# Patient Record
Sex: Female | Born: 1960 | Race: White | Hispanic: No | Marital: Married | Smoking: Never smoker
Health system: Southern US, Community
[De-identification: ages and names within clinical notes are randomized; demographics above are authoritative.]

## PROBLEM LIST (undated history)

## (undated) DIAGNOSIS — Z0000 Encounter for general adult medical examination without abnormal findings: Principal | ICD-10-CM

## (undated) DIAGNOSIS — Z789 Other specified health status: Secondary | ICD-10-CM

## (undated) DIAGNOSIS — Z8619 Personal history of other infectious and parasitic diseases: Secondary | ICD-10-CM

## (undated) DIAGNOSIS — R51 Headache: Secondary | ICD-10-CM

## (undated) DIAGNOSIS — G43909 Migraine, unspecified, not intractable, without status migrainosus: Secondary | ICD-10-CM

## (undated) DIAGNOSIS — E782 Mixed hyperlipidemia: Secondary | ICD-10-CM

## (undated) HISTORY — DX: Migraine, unspecified, not intractable, without status migrainosus: G43.909

## (undated) HISTORY — DX: Mixed hyperlipidemia: E78.2

## (undated) HISTORY — DX: Encounter for general adult medical examination without abnormal findings: Z00.00

## (undated) HISTORY — DX: Personal history of other infectious and parasitic diseases: Z86.19

---

## 1999-03-12 ENCOUNTER — Other Ambulatory Visit: Admission: RE | Admit: 1999-03-12 | Discharge: 1999-03-12 | Payer: Self-pay | Admitting: *Deleted

## 2000-03-24 ENCOUNTER — Other Ambulatory Visit: Admission: RE | Admit: 2000-03-24 | Discharge: 2000-03-24 | Payer: Self-pay | Admitting: *Deleted

## 2001-04-06 ENCOUNTER — Other Ambulatory Visit: Admission: RE | Admit: 2001-04-06 | Discharge: 2001-04-06 | Payer: Self-pay | Admitting: *Deleted

## 2002-04-05 ENCOUNTER — Other Ambulatory Visit: Admission: RE | Admit: 2002-04-05 | Discharge: 2002-04-05 | Payer: Self-pay | Admitting: *Deleted

## 2003-03-21 ENCOUNTER — Other Ambulatory Visit: Admission: RE | Admit: 2003-03-21 | Discharge: 2003-03-21 | Payer: Self-pay | Admitting: *Deleted

## 2004-04-02 ENCOUNTER — Other Ambulatory Visit: Admission: RE | Admit: 2004-04-02 | Discharge: 2004-04-02 | Payer: Self-pay | Admitting: *Deleted

## 2005-04-01 ENCOUNTER — Other Ambulatory Visit: Admission: RE | Admit: 2005-04-01 | Discharge: 2005-04-01 | Payer: Self-pay | Admitting: *Deleted

## 2005-11-01 ENCOUNTER — Ambulatory Visit: Payer: Self-pay | Admitting: Family Medicine

## 2006-03-31 ENCOUNTER — Other Ambulatory Visit: Admission: RE | Admit: 2006-03-31 | Discharge: 2006-03-31 | Payer: Self-pay | Admitting: *Deleted

## 2007-04-13 ENCOUNTER — Other Ambulatory Visit: Admission: RE | Admit: 2007-04-13 | Discharge: 2007-04-13 | Payer: Self-pay | Admitting: *Deleted

## 2007-07-18 ENCOUNTER — Encounter: Payer: Self-pay | Admitting: Family Medicine

## 2008-05-17 ENCOUNTER — Ambulatory Visit: Payer: Self-pay | Admitting: Family Medicine

## 2008-05-22 ENCOUNTER — Encounter (INDEPENDENT_AMBULATORY_CARE_PROVIDER_SITE_OTHER): Payer: Self-pay | Admitting: *Deleted

## 2008-05-30 ENCOUNTER — Telehealth (INDEPENDENT_AMBULATORY_CARE_PROVIDER_SITE_OTHER): Payer: Self-pay | Admitting: *Deleted

## 2010-01-11 HISTORY — PX: DILATION AND CURETTAGE OF UTERUS: SHX78

## 2012-02-22 ENCOUNTER — Encounter (HOSPITAL_BASED_OUTPATIENT_CLINIC_OR_DEPARTMENT_OTHER): Payer: Self-pay | Admitting: *Deleted

## 2012-02-23 ENCOUNTER — Other Ambulatory Visit: Payer: Self-pay | Admitting: Orthopedic Surgery

## 2012-02-25 ENCOUNTER — Encounter (HOSPITAL_BASED_OUTPATIENT_CLINIC_OR_DEPARTMENT_OTHER)
Admission: RE | Disposition: A | Discharge status: Home / Self Care | Payer: Self-pay | Source: Ambulatory Visit | Attending: Orthopedic Surgery

## 2012-02-25 ENCOUNTER — Ambulatory Visit (HOSPITAL_BASED_OUTPATIENT_CLINIC_OR_DEPARTMENT_OTHER)
Admission: RE | Admit: 2012-02-25 | Discharge: 2012-02-25 | Disposition: A | Discharge status: Home / Self Care | Payer: BC Managed Care – PPO | Source: Ambulatory Visit | Attending: Orthopedic Surgery | Admitting: Orthopedic Surgery

## 2012-02-25 DIAGNOSIS — M6530 Trigger finger, unspecified finger: Secondary | ICD-10-CM | POA: Insufficient documentation

## 2012-02-25 HISTORY — DX: Other specified health status: Z78.9

## 2012-02-25 HISTORY — DX: Headache: R51

## 2012-02-25 HISTORY — PX: TRIGGER FINGER RELEASE: SHX641

## 2012-02-25 SURGERY — MINOR RELEASE TRIGGER FINGER/A-1 PULLEY
Anesthesia: LOCAL | Site: Thumb | Laterality: Right | Wound class: Clean

## 2012-02-25 MED ORDER — CEFAZOLIN SODIUM-DEXTROSE 2-3 GM-% IV SOLR: 2.0000 g | INTRAVENOUS | Status: DC

## 2012-02-25 MED ORDER — LIDOCAINE HCL 2 % IJ SOLN
INTRAMUSCULAR | Status: DC | PRN
  Administered 2012-02-25: 6 mL via INTRADERMAL

## 2012-02-25 MED ORDER — POVIDONE-IODINE 7.5 % EX SOLN: Freq: Once | CUTANEOUS | Status: DC

## 2012-02-25 MED ORDER — HYDROCODONE-ACETAMINOPHEN 5-325 MG PO TABS: 1.0000 | ORAL_TABLET | Freq: Four times a day (QID) | ORAL | Status: DC | PRN

## 2012-02-25 SURGICAL SUPPLY — 47 items
BANDAGE ELASTIC 3 VELCRO ST LF (GAUZE/BANDAGES/DRESSINGS) ×2 IMPLANT
BLADE SURG 11 STRL SS (BLADE) IMPLANT
BLADE SURG 15 STRL LF DISP TIS (BLADE) ×1 IMPLANT
BLADE SURG 15 STRL SS (BLADE) ×2
BNDG CMPR 9X4 STRL LF SNTH (GAUZE/BANDAGES/DRESSINGS)
BNDG CMPR MD 5X2 ELC HKLP STRL (GAUZE/BANDAGES/DRESSINGS) ×1
BNDG ELASTIC 2 VLCR STRL LF (GAUZE/BANDAGES/DRESSINGS) ×2 IMPLANT
BNDG ESMARK 4X9 LF (GAUZE/BANDAGES/DRESSINGS) IMPLANT
CLOTH BEACON ORANGE TIMEOUT ST (SAFETY) ×2 IMPLANT
COVER MAYO STAND STRL (DRAPES) ×2 IMPLANT
COVER TABLE BACK 60X90 (DRAPES) ×2 IMPLANT
CUFF TOURNIQUET SINGLE 18IN (TOURNIQUET CUFF) ×1 IMPLANT
DECANTER SPIKE VIAL GLASS SM (MISCELLANEOUS) ×2 IMPLANT
DRAPE EXTREMITY T 121X128X90 (DRAPE) ×2 IMPLANT
DRSG EMULSION OIL 3X3 NADH (GAUZE/BANDAGES/DRESSINGS) IMPLANT
DURAPREP 26ML APPLICATOR (WOUND CARE) ×2 IMPLANT
ELECT NDL TIP 2.8 STRL (NEEDLE) IMPLANT
ELECT NEEDLE TIP 2.8 STRL (NEEDLE) IMPLANT
ELECT REM PT RETURN 9FT ADLT (ELECTROSURGICAL)
ELECTRODE REM PT RTRN 9FT ADLT (ELECTROSURGICAL) IMPLANT
GAUZE SPONGE 4X4 12PLY STRL LF (GAUZE/BANDAGES/DRESSINGS) ×2 IMPLANT
GLOVE BIOGEL PI IND STRL 7.0 (GLOVE) IMPLANT
GLOVE BIOGEL PI IND STRL 8 (GLOVE) ×2 IMPLANT
GLOVE BIOGEL PI INDICATOR 7.0 (GLOVE) ×2
GLOVE BIOGEL PI INDICATOR 8 (GLOVE) ×2
GLOVE ECLIPSE 6.5 STRL STRAW (GLOVE) ×1 IMPLANT
GLOVE ECLIPSE 7.5 STRL STRAW (GLOVE) ×4 IMPLANT
GOWN BRE IMP PREV XXLGXLNG (GOWN DISPOSABLE) ×2 IMPLANT
GOWN PREVENTION PLUS XLARGE (GOWN DISPOSABLE) ×2 IMPLANT
GOWN PREVENTION PLUS XXLARGE (GOWN DISPOSABLE) ×2 IMPLANT
NDL HYPO 25X1 1.5 SAFETY (NEEDLE) ×1 IMPLANT
NEEDLE HYPO 25X1 1.5 SAFETY (NEEDLE) ×4 IMPLANT
PACK BASIN DAY SURGERY FS (CUSTOM PROCEDURE TRAY) ×2 IMPLANT
PAD CAST 3X4 CTTN HI CHSV (CAST SUPPLIES) ×1 IMPLANT
PADDING CAST ABS 4INX4YD NS (CAST SUPPLIES) ×1
PADDING CAST ABS COTTON 4X4 ST (CAST SUPPLIES) ×1 IMPLANT
PADDING CAST COTTON 3X4 STRL (CAST SUPPLIES) ×2
PADDING UNDERCAST 2  STERILE (CAST SUPPLIES) ×2 IMPLANT
PENCIL BUTTON HOLSTER BLD 10FT (ELECTRODE) IMPLANT
STOCKINETTE 4X48 STRL (DRAPES) ×2 IMPLANT
SUT ETHILON 4 0 PS 2 18 (SUTURE) ×2 IMPLANT
SYR BULB 3OZ (MISCELLANEOUS) ×2 IMPLANT
SYR CONTROL 10ML LL (SYRINGE) ×2 IMPLANT
TOWEL OR 17X24 6PK STRL BLUE (TOWEL DISPOSABLE) ×3 IMPLANT
TOWEL OR NON WOVEN STRL DISP B (DISPOSABLE) ×2 IMPLANT
UNDERPAD 30X30 INCONTINENT (UNDERPADS AND DIAPERS) ×2 IMPLANT
WATER STERILE IRR 1000ML POUR (IV SOLUTION) ×2 IMPLANT

## 2012-02-25 NOTE — Brief Op Note (Signed)
02/25/2012  8:11 AM  PATIENT:  Marylu Lund  52 y.o. female  PRE-OPERATIVE DIAGNOSIS:  RIGHT TRIGGER THUMB  POST-OPERATIVE DIAGNOSIS:  RIGHT TRIGGER THUMB  PROCEDURE:  Procedure(s): MINOR RELEASE TRIGGER FINGER/A-1 PULLEY (Right)  SURGEON:  Surgeon(s) and Role:    * Harvie Junior, MD - Primary  PHYSICIAN ASSISTANT:   ASSISTANTS: bethune   ANESTHESIA:   local  EBL:     BLOOD ADMINISTERED:none  DRAINS: none   LOCAL MEDICATIONS USED:  LIDOCAINE   SPECIMEN:  No Specimen  DISPOSITION OF SPECIMEN:  N/A  COUNTS:  YES  TOURNIQUET:  * Missing tourniquet times found for documented tourniquets in log:  [id number] *  DICTATION: .Other Dictation: Dictation Number [id number]  PLAN OF CARE: Discharge to home after PACU  PATIENT DISPOSITION:  PACU - hemodynamically stable.   Delay start of Pharmacological VTE agent (>24hrs) due to surgical blood loss or risk of bleeding: no

## 2012-02-25 NOTE — Op Note (Signed)
NAME:  Alicia Parrish, Alicia Parrish NO.:  [id number]  MEDICAL RECORD NO.:  [id number]  LOCATION:                                 FACILITY:  PHYSICIAN:  Harvie Junior, M.D.        DATE OF BIRTH:  DATE OF PROCEDURE:  02/25/2012 DATE OF DISCHARGE:                              OPERATIVE REPORT   PREOPERATIVE DIAGNOSIS:  Trigger finger thumb, right.  POSTOPERATIVE DIAGNOSIS:  Trigger finger thumb, right.  PROCEDURE:  Right trigger thumb release.  SURGEON:  Harvie Junior, MD  ASSISTANT:  Marshia Ly, PA  ANESTHESIA:  General.  BRIEF HISTORY:  Ms. Murthy is a 52 year old female with history of having a right trigger thumb.  We treated conservatively for prolonged period of time.  She was to be brought to the operating room for release of right trigger thumb.  For financial reasons, the patient wanted to be done under local.  We discussed with her the risk of having to use significant amounts of numbing medicine in the area where we operate, clouding the tissue planes.  She understood this risk and still wanted to be done under local and she was brought to the operating room for this procedure.  DESCRIPTION OF PROCEDURE:  The patient was brought to the local operating room and at this point after prepping the skin, I injected her in a V formation just proximal to where we anticipated making our incision, then went on to scrub.  At this point, the patient was prepped and draped in usual sterile fashion.  Following this, the arm was exsanguinated, blood pressure inflated to 250 mmHg.  Following this, an incision was made slightly more generous than our typical incision and the subcutaneous tissue down the level of the A1 pulley.  Tissue planes were clouded as anticipated and [employer] sponges were used to try to identify the tissue planes as well as we could.  We used retractors to hold the digital nerves out of the way as best we could and ultimately were able to identify  the A1 pulley.  Small rent was made with a knife and then the A1 pulley was released with a scissor.  We were able to pull the tendon into the wound with a right angle retractor.  At that point, I was able to actually ask the patient to flex and extend the thumb.  No triggering was identified at this point.  At this point, the wound was irrigated, suctioned dry, closed with a running stitch.  Sterile compressive dressing was applied.  The patient taken to recovery room and was noted to be in satisfactory condition.  Estimated blood loss for procedure was none.     Harvie Junior, M.D.     Ranae Plumber  D:  02/25/2012  T:  02/25/2012  Job:  [id number]

## 2012-02-25 NOTE — H&P (Signed)
  PREOPERATIVE H&P  Chief Complaint: r trigger thumb  HPI: Alicia Parrish is a 52 y.o. female who presents for evaluation of r trigger thumb. It has been present for greater than 3 months and has been worsening. She has failed conservative measures. Pain is rated as moderate.  Past Medical History  Diagnosis Date  . Medical history non-contributory   . Headache    Past Surgical History  Procedure Laterality Date  . Dilation and curettage of uterus  2012   History   Social History  . Marital Status: Married    Spouse Name: N/A    Number of Children: N/A  . Years of Education: N/A   Social History Main Topics  . Smoking status: Never Smoker   . Smokeless tobacco: None  . Alcohol Use: Yes     Comment: rare  . Drug Use: No  . Sexually Active: None   Other Topics Concern  . None   Social History Narrative  . None   History reviewed. No pertinent family history. No Known Allergies Prior to Admission medications   Medication Sig Start Date End Date Taking? Authorizing Provider  calcium carbonate (OS-CAL) 600 MG TABS Take 600 mg by mouth 2 (two) times daily with a meal.   Yes Historical Provider, MD  Multiple Vitamins-Minerals (MULTIVITAMIN WITH MINERALS) tablet Take 1 tablet by mouth daily.   Yes Historical Provider, MD  rizatriptan (MAXALT) 10 MG tablet Take 10 mg by mouth as needed for migraine. May repeat in 2 hours if needed   Yes Historical Provider, MD  zinc gluconate 50 MG tablet Take 50 mg by mouth daily.   Yes Historical Provider, MD     Positive ROS: none  All other systems have been reviewed and were otherwise negative with the exception of those mentioned in the HPI and as above.  Physical Exam: There were no vitals filed for this visit.  General: Alert, no acute distress Cardiovascular: No pedal edema Respiratory: No cyanosis, no use of accessory musculature GI: No organomegaly, abdomen is soft and non-tender Skin: No lesions in the area of chief  complaint Neurologic: Sensation intact distally Psychiatric: Patient is competent for consent with normal mood and affect Lymphatic: No axillary or cervical lymphadenopathy  MUSCULOSKELETAL: r thumb: +ttp over a-1 pulley + catching  Assessment/Plan: RIGHT TRIGGER THUMB Plan for Procedure(s): MINOR RELEASE TRIGGER FINGER/A-1 PULLEY  The risks benefits and alternatives were discussed with the patient including but not limited to the risks of nonoperative treatment, versus surgical intervention including infection, bleeding, nerve injury, malunion, nonunion, hardware prominence, hardware failure, need for hardware removal, blood clots, cardiopulmonary complications, morbidity, mortality, among others, and they were willing to proceed.  Predicted outcome is good, although there will be at least a six to nine month expected recovery.  Alicia Kosa L, MD 02/25/2012 7:34 AM

## 2012-02-28 ENCOUNTER — Encounter (HOSPITAL_BASED_OUTPATIENT_CLINIC_OR_DEPARTMENT_OTHER): Payer: Self-pay | Admitting: Orthopedic Surgery

## 2012-05-18 ENCOUNTER — Encounter: Payer: Self-pay | Admitting: Family Medicine

## 2012-05-18 ENCOUNTER — Ambulatory Visit (INDEPENDENT_AMBULATORY_CARE_PROVIDER_SITE_OTHER): Payer: BC Managed Care – PPO | Admitting: Family Medicine

## 2012-05-18 VITALS — BP 110/70 | HR 73 | Temp 98.2°F | Ht 63.75 in | Wt 109.9 lb

## 2012-05-18 DIAGNOSIS — L819 Disorder of pigmentation, unspecified: Secondary | ICD-10-CM | POA: Insufficient documentation

## 2012-05-18 NOTE — Progress Notes (Signed)
  Subjective:    Patient ID: Alicia Parrish, female    DOB: 1960/02/24, 52 y.o.   MRN: [id number]  HPI Bumps on legs- last year went to [facility] for laser hair removal.  Red bumps developed same day or day after.  Have not resolved in the year since.  Not painful.  aesthetician said pt is 1st they've seen.  No itching.   Review of Systems For ROS see HPI     Objective:   Physical Exam  Vitals reviewed. Constitutional: She appears well-developed and well-nourished. No distress.  Skin: Skin is warm and dry. No rash noted. No erythema.  Diffusely scattered non blanching, pinpoint red dots at site of each hair follicle after laser hair removal.          Assessment & Plan:

## 2012-05-18 NOTE — Patient Instructions (Addendum)
We'll call you with your Derm appt Call with any questions or concerns Hang in there!!! 

## 2012-05-28 NOTE — Assessment & Plan Note (Signed)
New.  Suspect these are scars from laer hair removal.  Unclear if there is anything to be done for this but will refer to derm for complete evaluation.  Pt expressed understanding and is in agreement w/ plan.

## 2012-08-24 ENCOUNTER — Ambulatory Visit (INDEPENDENT_AMBULATORY_CARE_PROVIDER_SITE_OTHER): Payer: BC Managed Care – PPO | Admitting: Family Medicine

## 2012-08-24 ENCOUNTER — Encounter: Payer: Self-pay | Admitting: Family Medicine

## 2012-08-24 VITALS — BP 122/80 | HR 74 | Temp 98.3°F | Ht 63.75 in | Wt 105.4 lb

## 2012-08-24 DIAGNOSIS — Z0000 Encounter for general adult medical examination without abnormal findings: Secondary | ICD-10-CM

## 2012-08-24 DIAGNOSIS — Z1331 Encounter for screening for depression: Secondary | ICD-10-CM

## 2012-08-24 NOTE — Progress Notes (Signed)
  Subjective:    Patient ID: Alicia Parrish, female    DOB: 1960-04-01, 52 y.o.   MRN: [id number]  HPI CPE- UTD on GYN, due for colonoscopy.  Has high deductible plan and not interested in colonoscopy or EKG.  Completed stool cards w/ GYN.   Review of Systems Patient reports no vision/ hearing changes, adenopathy,fever, weight change,  persistant/recurrent hoarseness , swallowing issues, chest pain, palpitations, edema, persistant/recurrent cough, hemoptysis, dyspnea (rest/exertional/paroxysmal nocturnal), gastrointestinal bleeding (melena, rectal bleeding), abdominal pain, significant heartburn, bowel changes, GU symptoms (dysuria, hematuria, incontinence), Gyn symptoms (abnormal  bleeding, pain),  syncope, focal weakness, memory loss, numbness & tingling, skin/hair/nail changes, abnormal bruising or bleeding, anxiety, or depression.     Objective:   Physical Exam General Appearance:    Alert, cooperative, no distress, appears stated age  Head:    Normocephalic, without obvious abnormality, atraumatic  Eyes:    PERRL, conjunctiva/corneas clear, EOM's intact, fundi    benign, both eyes  Ears:    Normal TM's and external ear canals, both ears  Nose:   Nares normal, septum midline, mucosa normal, no drainage    or sinus tenderness  Throat:   Lips, mucosa, and tongue normal; teeth and gums normal  Neck:   Supple, symmetrical, trachea midline, no adenopathy;    Thyroid: no enlargement/tenderness/nodules  Back:     Symmetric, no curvature, ROM normal, no CVA tenderness  Lungs:     Clear to auscultation bilaterally, respirations unlabored  Chest Wall:    No tenderness or deformity   Heart:    Regular rate and rhythm, S1 and S2 normal, no murmur, rub   or gallop  Breast Exam:    Deferred to GYN  Abdomen:     Soft, non-tender, bowel sounds active all four quadrants,    no masses, no organomegaly  Genitalia:    Deferred to GYN  Rectal:    Extremities:   Extremities normal, atraumatic, no cyanosis  or edema  Pulses:   2+ and symmetric all extremities  Skin:   Skin color, texture, turgor normal, no rashes or lesions  Lymph nodes:   Cervical, supraclavicular, and axillary nodes normal  Neurologic:   CNII-XII intact, normal strength, sensation and reflexes    throughout  \       Assessment & Plan:

## 2012-08-24 NOTE — Patient Instructions (Addendum)
Follow up in 1 year or as needed Keep up the good work!  You look great! We'll notify you of your lab results and make any changes if needed Call with any questions or concerns Enjoy the rest of your summer!!! 

## 2012-08-24 NOTE — Assessment & Plan Note (Signed)
Pt's PE WNL.  UTD on GYN.  Declines colonoscopy due to high deductible plan.  Has completed stool cards for GYN.  Check labs.  Anticipatory guidance provided.

## 2012-08-25 LAB — HEPATIC FUNCTION PANEL
Alkaline Phosphatase: 75 U/L (ref 39–117)
Bilirubin, Direct: 0.1 mg/dL (ref 0.0–0.3)

## 2012-08-25 LAB — CBC WITH DIFFERENTIAL/PLATELET
Basophils Absolute: 0.1 10*3/uL (ref 0.0–0.1)
Eosinophils Relative: 0.7 % (ref 0.0–5.0)
HCT: 40 % (ref 36.0–46.0)
Hemoglobin: 13.4 g/dL (ref 12.0–15.0)
Lymphocytes Relative: 27.7 % (ref 12.0–46.0)
Lymphs Abs: 2.1 10*3/uL (ref 0.7–4.0)
Monocytes Relative: 5.3 % (ref 3.0–12.0)
Neutro Abs: 4.9 10*3/uL (ref 1.4–7.7)
Platelets: 199 10*3/uL (ref 150.0–400.0)
RDW: 13.7 % (ref 11.5–14.6)
WBC: 7.6 10*3/uL (ref 4.5–10.5)

## 2012-08-25 LAB — BASIC METABOLIC PANEL
CO2: 30 mEq/L (ref 19–32)
Chloride: 102 mEq/L (ref 96–112)
Glucose, Bld: 80 mg/dL (ref 70–99)
Sodium: 138 mEq/L (ref 135–145)

## 2012-08-25 LAB — LIPID PANEL
Cholesterol: 221 mg/dL — ABNORMAL HIGH (ref 0–200)
HDL: 87.6 mg/dL (ref >39.00)
Triglycerides: 65 mg/dL (ref 0.0–149.0)
VLDL: 13 mg/dL (ref 0.0–40.0)

## 2012-08-25 LAB — TSH: TSH: 0.7 u[IU]/mL (ref 0.35–5.50)

## 2012-08-28 ENCOUNTER — Encounter: Payer: Self-pay | Admitting: General Practice

## 2013-08-23 LAB — HM DEXA SCAN

## 2014-03-05 ENCOUNTER — Other Ambulatory Visit: Payer: Self-pay | Admitting: Gynecology

## 2014-03-05 DIAGNOSIS — R928 Other abnormal and inconclusive findings on diagnostic imaging of breast: Secondary | ICD-10-CM

## 2014-03-08 ENCOUNTER — Ambulatory Visit
Admission: RE | Admit: 2014-03-08 | Discharge: 2014-03-08 | Disposition: A | Discharge status: Home / Self Care | Payer: 59 | Source: Ambulatory Visit | Attending: Gynecology | Admitting: Gynecology

## 2014-03-08 ENCOUNTER — Other Ambulatory Visit: Payer: Self-pay | Admitting: Gynecology

## 2014-03-08 DIAGNOSIS — R928 Other abnormal and inconclusive findings on diagnostic imaging of breast: Secondary | ICD-10-CM

## 2014-03-12 ENCOUNTER — Other Ambulatory Visit: Payer: Self-pay

## 2014-03-14 ENCOUNTER — Telehealth: Payer: Self-pay | Admitting: Family Medicine

## 2014-03-14 ENCOUNTER — Other Ambulatory Visit: Payer: Self-pay

## 2014-03-14 DIAGNOSIS — Z0100 Encounter for examination of eyes and vision without abnormal findings: Secondary | ICD-10-CM

## 2014-03-14 DIAGNOSIS — Z1283 Encounter for screening for malignant neoplasm of skin: Secondary | ICD-10-CM

## 2014-03-14 NOTE — Telephone Encounter (Signed)
Caller name: Roosevelt Relation to pt: self Call back number: [phone] Pharmacy:  Reason for call[facility]   [facility]/14 for a yearly mole check and needs referral for this.  Also, has appointment with Dr. Quenten Ravenobert Davanzo for a routine eye exam and will need referral for that. Appointment is in August.

## 2014-03-14 NOTE — Telephone Encounter (Signed)
Ok for referrals to her specialists but in order for me to continue to authorize other providers to treat, pt needs to schedule CPE

## 2014-03-14 NOTE — Telephone Encounter (Signed)
Please advise pt has not bee seen since 2014, no upcoming appts

## 2014-03-14 NOTE — Telephone Encounter (Signed)
Left message with husband for patient to return my call

## 2014-03-14 NOTE — Telephone Encounter (Signed)
Referrals placed. Pt needs an appt with Provider for a complete physical

## 2014-04-15 ENCOUNTER — Telehealth: Payer: Self-pay | Admitting: Family Medicine

## 2014-04-15 DIAGNOSIS — G4489 Other headache syndrome: Secondary | ICD-10-CM

## 2014-04-15 NOTE — Telephone Encounter (Signed)
Ok for referral to Dr [facility] ([facility]

## 2014-04-15 NOTE — Telephone Encounter (Signed)
Caller name: Marylu LundGonet, Asusena Relation to pt: self  Call back number: [phone]   Reason for call:  Pt requesting a referral for headache and wellness specialist Dr. Ala BentMartin Freedman contact [phone]. Pt has [employer] compass.

## 2014-04-15 NOTE — Telephone Encounter (Signed)
Referral placed.

## 2014-08-29 ENCOUNTER — Encounter: Payer: 59 | Admitting: Family Medicine

## 2015-01-23 ENCOUNTER — Encounter: Payer: 59 | Admitting: Family Medicine

## 2015-03-19 ENCOUNTER — Telehealth: Payer: Self-pay | Admitting: *Deleted

## 2015-03-19 NOTE — Telephone Encounter (Signed)
Unable to reach patient at time of pre-visit call. Left message for patient to return call when available.  

## 2015-03-20 ENCOUNTER — Encounter: Payer: Self-pay | Admitting: Family Medicine

## 2015-03-20 ENCOUNTER — Ambulatory Visit (INDEPENDENT_AMBULATORY_CARE_PROVIDER_SITE_OTHER): Payer: BLUE CROSS/BLUE SHIELD | Admitting: Family Medicine

## 2015-03-20 VITALS — BP 108/60 | HR 82 | Temp 97.6°F | Ht 64.5 in | Wt 109.0 lb

## 2015-03-20 DIAGNOSIS — Z0000 Encounter for general adult medical examination without abnormal findings: Secondary | ICD-10-CM | POA: Diagnosis not present

## 2015-03-20 DIAGNOSIS — E782 Mixed hyperlipidemia: Secondary | ICD-10-CM | POA: Diagnosis not present

## 2015-03-20 DIAGNOSIS — G43909 Migraine, unspecified, not intractable, without status migrainosus: Secondary | ICD-10-CM

## 2015-03-20 DIAGNOSIS — Z8619 Personal history of other infectious and parasitic diseases: Secondary | ICD-10-CM | POA: Insufficient documentation

## 2015-03-20 HISTORY — DX: Mixed hyperlipidemia: E78.2

## 2015-03-20 HISTORY — DX: Encounter for general adult medical examination without abnormal findings: Z00.00

## 2015-03-20 NOTE — Patient Instructions (Signed)
Preventive Care for Adults, Female A healthy lifestyle and preventive care can promote health and wellness. Preventive health guidelines for women include the following key practices.  A routine yearly physical is a good way to check with your health care provider about your health and preventive screening. It is a chance to share any concerns and updates on your health and to receive a thorough exam.  Visit your dentist for a routine exam and preventive care every 6 months. Brush your teeth twice a day and floss once a day. Good oral hygiene prevents tooth decay and gum disease.  The frequency of eye exams is based on your age, health, family medical history, use of contact lenses, and other factors. Follow your health care provider's recommendations for frequency of eye exams.  Eat a healthy diet. Foods like vegetables, fruits, whole grains, low-fat dairy products, and lean protein foods contain the nutrients you need without too many calories. Decrease your intake of foods high in solid fats, added sugars, and salt. Eat the right amount of calories for you.Get information about a proper diet from your health care provider, if necessary.  Regular physical exercise is one of the most important things you can do for your health. Most adults should get at least 150 minutes of moderate-intensity exercise (any activity that increases your heart rate and causes you to sweat) each week. In addition, most adults need muscle-strengthening exercises on 2 or more days a week.  Maintain a healthy weight. The body mass index (BMI) is a screening tool to identify possible weight problems. It provides an estimate of body fat based on height and weight. Your health care provider can find your BMI and can help you achieve or maintain a healthy weight.For adults 20 years and older:  A BMI below 18.5 is considered underweight.  A BMI of 18.5 to 24.9 is normal.  A BMI of 25 to 29.9 is considered overweight.  A  BMI of 30 and above is considered obese.  Maintain normal blood lipids and cholesterol levels by exercising and minimizing your intake of saturated fat. Eat a balanced diet with plenty of fruit and vegetables. Blood tests for lipids and cholesterol should begin at age 45 and be repeated every 5 years. If your lipid or cholesterol levels are high, you are over 50, or you are at high risk for heart disease, you may need your cholesterol levels checked more frequently.Ongoing high lipid and cholesterol levels should be treated with medicines if diet and exercise are not working.  If you smoke, find out from your health care provider how to quit. If you do not use tobacco, do not start.  Lung cancer screening is recommended for adults aged 45-80 years who are at high risk for developing lung cancer because of a history of smoking. A yearly low-dose CT scan of the lungs is recommended for people who have at least a 30-pack-year history of smoking and are a current smoker or have quit within the past 15 years. A pack year of smoking is smoking an average of 1 pack of cigarettes a day for 1 year (for example: 1 pack a day for 30 years or 2 packs a day for 15 years). Yearly screening should continue until the smoker has stopped smoking for at least 15 years. Yearly screening should be stopped for people who develop a health problem that would prevent them from having lung cancer treatment.  If you are pregnant, do not drink alcohol. If you are  breastfeeding, be very cautious about drinking alcohol. If you are not pregnant and choose to drink alcohol, do not have more than 1 drink per day. One drink is considered to be 12 ounces (355 mL) of beer, 5 ounces (148 mL) of wine, or 1.5 ounces (44 mL) of liquor.  Avoid use of street drugs. Do not share needles with anyone. Ask for help if you need support or instructions about stopping the use of drugs.  High blood pressure causes heart disease and increases the risk  of stroke. Your blood pressure should be checked at least every 1 to 2 years. Ongoing high blood pressure should be treated with medicines if weight loss and exercise do not work.  If you are 55-79 years old, ask your health care provider if you should take aspirin to prevent strokes.  Diabetes screening is done by taking a blood sample to check your blood glucose level after you have not eaten for a certain period of time (fasting). If you are not overweight and you do not have risk factors for diabetes, you should be screened once every 3 years starting at age 45. If you are overweight or obese and you are 40-70 years of age, you should be screened for diabetes every year as part of your cardiovascular risk assessment.  Breast cancer screening is essential preventive care for women. You should practice "breast self-awareness." This means understanding the normal appearance and feel of your breasts and may include breast self-examination. Any changes detected, no matter how small, should be reported to a health care provider. Women in their 20s and 30s should have a clinical breast exam (CBE) by a health care provider as part of a regular health exam every 1 to 3 years. After age 40, women should have a CBE every year. Starting at age 40, women should consider having a mammogram (breast X-ray test) every year. Women who have a family history of breast cancer should talk to their health care provider about genetic screening. Women at a high risk of breast cancer should talk to their health care providers about having an MRI and a mammogram every year.  Breast cancer gene (BRCA)-related cancer risk assessment is recommended for women who have family members with BRCA-related cancers. BRCA-related cancers include breast, ovarian, tubal, and peritoneal cancers. Having family members with these cancers may be associated with an increased risk for harmful changes (mutations) in the breast cancer genes BRCA1 and  BRCA2. Results of the assessment will determine the need for genetic counseling and BRCA1 and BRCA2 testing.  Your health care provider may recommend that you be screened regularly for cancer of the pelvic organs (ovaries, uterus, and vagina). This screening involves a pelvic examination, including checking for microscopic changes to the surface of your cervix (Pap test). You may be encouraged to have this screening done every 3 years, beginning at age 21.  For women ages 30-65, health care providers may recommend pelvic exams and Pap testing every 3 years, or they may recommend the Pap and pelvic exam, combined with testing for human papilloma virus (HPV), every 5 years. Some types of HPV increase your risk of cervical cancer. Testing for HPV may also be done on women of any age with unclear Pap test results.  Other health care providers may not recommend any screening for nonpregnant women who are considered low risk for pelvic cancer and who do not have symptoms. Ask your health care provider if a screening pelvic exam is right for   you.  If you have had past treatment for cervical cancer or a condition that could lead to cancer, you need Pap tests and screening for cancer for at least 20 years after your treatment. If Pap tests have been discontinued, your risk factors (such as having a new sexual partner) need to be reassessed to determine if screening should resume. Some women have medical problems that increase the chance of getting cervical cancer. In these cases, your health care provider may recommend more frequent screening and Pap tests.  Colorectal cancer can be detected and often prevented. Most routine colorectal cancer screening begins at the age of 50 years and continues through age 75 years. However, your health care provider may recommend screening at an earlier age if you have risk factors for colon cancer. On a yearly basis, your health care provider may provide home test kits to check  for hidden blood in the stool. Use of a small camera at the end of a tube, to directly examine the colon (sigmoidoscopy or colonoscopy), can detect the earliest forms of colorectal cancer. Talk to your health care provider about this at age 50, when routine screening begins. Direct exam of the colon should be repeated every 5-10 years through age 75 years, unless early forms of precancerous polyps or small growths are found.  People who are at an increased risk for hepatitis B should be screened for this virus. You are considered at high risk for hepatitis B if:  You were born in a country where hepatitis B occurs often. Talk with your health care provider about which countries are considered high risk.  Your parents were born in a high-risk country and you have not received a shot to protect against hepatitis B (hepatitis B vaccine).  You have HIV or AIDS.  You use needles to inject street drugs.  You live with, or have sex with, someone who has hepatitis B.  You get hemodialysis treatment.  You take certain medicines for conditions like cancer, organ transplantation, and autoimmune conditions.  Hepatitis C blood testing is recommended for all people born from 1945 through 1965 and any individual with known risks for hepatitis C.  Practice safe sex. Use condoms and avoid high-risk sexual practices to reduce the spread of sexually transmitted infections (STIs). STIs include gonorrhea, chlamydia, syphilis, trichomonas, herpes, HPV, and human immunodeficiency virus (HIV). Herpes, HIV, and HPV are viral illnesses that have no cure. They can result in disability, cancer, and death.  You should be screened for sexually transmitted illnesses (STIs) including gonorrhea and chlamydia if:  You are sexually active and are younger than 24 years.  You are older than 24 years and your health care provider tells you that you are at risk for this type of infection.  Your sexual activity has changed  since you were last screened and you are at an increased risk for chlamydia or gonorrhea. Ask your health care provider if you are at risk.  If you are at risk of being infected with HIV, it is recommended that you take a prescription medicine daily to prevent HIV infection. This is called preexposure prophylaxis (PrEP). You are considered at risk if:  You are sexually active and do not regularly use condoms or know the HIV status of your partner(s).  You take drugs by injection.  You are sexually active with a partner who has HIV.  Talk with your health care provider about whether you are at high risk of being infected with HIV. If   you choose to begin PrEP, you should first be tested for HIV. You should then be tested every 3 months for as long as you are taking PrEP.  Osteoporosis is a disease in which the bones lose minerals and strength with aging. This can result in serious bone fractures or breaks. The risk of osteoporosis can be identified using a bone density scan. Women ages 67 years and over and women at risk for fractures or osteoporosis should discuss screening with their health care providers. Ask your health care provider whether you should take a calcium supplement or vitamin D to reduce the rate of osteoporosis.  Menopause can be associated with physical symptoms and risks. Hormone replacement therapy is available to decrease symptoms and risks. You should talk to your health care provider about whether hormone replacement therapy is right for you.  Use sunscreen. Apply sunscreen liberally and repeatedly throughout the day. You should seek shade when your shadow is shorter than you. Protect yourself by wearing long sleeves, pants, a wide-brimmed hat, and sunglasses year round, whenever you are outdoors.  Once a month, do a whole body skin exam, using a mirror to look at the skin on your back. Tell your health care provider of new moles, moles that have irregular borders, moles that  are larger than a pencil eraser, or moles that have changed in shape or color.  Stay current with required vaccines (immunizations).  Influenza vaccine. All adults should be immunized every year.  Tetanus, diphtheria, and acellular pertussis (Td, Tdap) vaccine. Pregnant women should receive 1 dose of Tdap vaccine during each pregnancy. The dose should be obtained regardless of the length of time since the last dose. Immunization is preferred during the 27th-36th week of gestation. An adult who has not previously received Tdap or who does not know her vaccine status should receive 1 dose of Tdap. This initial dose should be followed by tetanus and diphtheria toxoids (Td) booster doses every 10 years. Adults with an unknown or incomplete history of completing a 3-dose immunization series with Td-containing vaccines should begin or complete a primary immunization series including a Tdap dose. Adults should receive a Td booster every 10 years.  Varicella vaccine. An adult without evidence of immunity to varicella should receive 2 doses or a second dose if she has previously received 1 dose. Pregnant females who do not have evidence of immunity should receive the first dose after pregnancy. This first dose should be obtained before leaving the health care facility. The second dose should be obtained 4-8 weeks after the first dose.  Human papillomavirus (HPV) vaccine. Females aged 13-26 years who have not received the vaccine previously should obtain the 3-dose series. The vaccine is not recommended for use in pregnant females. However, pregnancy testing is not needed before receiving a dose. If a female is found to be pregnant after receiving a dose, no treatment is needed. In that case, the remaining doses should be delayed until after the pregnancy. Immunization is recommended for any person with an immunocompromised condition through the age of 61 years if she did not get any or all doses earlier. During the  3-dose series, the second dose should be obtained 4-8 weeks after the first dose. The third dose should be obtained 24 weeks after the first dose and 16 weeks after the second dose.  Zoster vaccine. One dose is recommended for adults aged 30 years or older unless certain conditions are present.  Measles, mumps, and rubella (MMR) vaccine. Adults born  before 1957 generally are considered immune to measles and mumps. Adults born in 1957 or later should have 1 or more doses of MMR vaccine unless there is a contraindication to the vaccine or there is laboratory evidence of immunity to each of the three diseases. A routine second dose of MMR vaccine should be obtained at least 28 days after the first dose for students attending postsecondary schools, health care workers, or international travelers. People who received inactivated measles vaccine or an unknown type of measles vaccine during 1963-1967 should receive 2 doses of MMR vaccine. People who received inactivated mumps vaccine or an unknown type of mumps vaccine before 1979 and are at high risk for mumps infection should consider immunization with 2 doses of MMR vaccine. For females of childbearing age, rubella immunity should be determined. If there is no evidence of immunity, females who are not pregnant should be vaccinated. If there is no evidence of immunity, females who are pregnant should delay immunization until after pregnancy. Unvaccinated health care workers born before 1957 who lack laboratory evidence of measles, mumps, or rubella immunity or laboratory confirmation of disease should consider measles and mumps immunization with 2 doses of MMR vaccine or rubella immunization with 1 dose of MMR vaccine.  Pneumococcal 13-valent conjugate (PCV13) vaccine. When indicated, a person who is uncertain of his immunization history and has no record of immunization should receive the PCV13 vaccine. All adults 65 years of age and older should receive this  vaccine. An adult aged 19 years or older who has certain medical conditions and has not been previously immunized should receive 1 dose of PCV13 vaccine. This PCV13 should be followed with a dose of pneumococcal polysaccharide (PPSV23) vaccine. Adults who are at high risk for pneumococcal disease should obtain the PPSV23 vaccine at least 8 weeks after the dose of PCV13 vaccine. Adults older than 53 years of age who have normal immune system function should obtain the PPSV23 vaccine dose at least 1 year after the dose of PCV13 vaccine.  Pneumococcal polysaccharide (PPSV23) vaccine. When PCV13 is also indicated, PCV13 should be obtained first. All adults aged 65 years and older should be immunized. An adult younger than age 65 years who has certain medical conditions should be immunized. Any person who resides in a nursing home or long-term care facility should be immunized. An adult smoker should be immunized. People with an immunocompromised condition and certain other conditions should receive both PCV13 and PPSV23 vaccines. People with human immunodeficiency virus (HIV) infection should be immunized as soon as possible after diagnosis. Immunization during chemotherapy or radiation therapy should be avoided. Routine use of PPSV23 vaccine is not recommended for American Indians, [place] Natives, or people younger than 65 years unless there are medical conditions that require PPSV23 vaccine. When indicated, people who have unknown immunization and have no record of immunization should receive PPSV23 vaccine. One-time revaccination 5 years after the first dose of PPSV23 is recommended for people aged 19-64 years who have chronic kidney failure, nephrotic syndrome, asplenia, or immunocompromised conditions. People who received 1-2 doses of PPSV23 before age 65 years should receive another dose of PPSV23 vaccine at age 65 years or later if at least 5 years have passed since the previous dose. Doses of PPSV23 are not  needed for people immunized with PPSV23 at or after age 65 years.  Meningococcal vaccine. Adults with asplenia or persistent complement component deficiencies should receive 2 doses of quadrivalent meningococcal conjugate (MenACWY-D) vaccine. The doses should be obtained   at least 2 months apart. Microbiologists working with certain meningococcal bacteria, [place] recruits, people at risk during an outbreak, and people who travel to or live in countries with a high rate of meningitis should be immunized. A first-year college student up through age 34 years who is living in a residence hall should receive a dose if she did not receive a dose on or after her 16th birthday. Adults who have certain high-risk conditions should receive one or more doses of vaccine.  Hepatitis A vaccine. Adults who wish to be protected from this disease, have certain high-risk conditions, work with hepatitis A-infected animals, work in hepatitis A research labs, or travel to or work in countries with a high rate of hepatitis A should be immunized. Adults who were previously unvaccinated and who anticipate close contact with an international adoptee during the first 60 days after arrival in the [place] from a country with a high rate of hepatitis A should be immunized.  Hepatitis B vaccine. Adults who wish to be protected from this disease, have certain high-risk conditions, may be exposed to blood or other infectious body fluids, are household contacts or sex partners of hepatitis B positive people, are clients or workers in certain care facilities, or travel to or work in countries with a high rate of hepatitis B should be immunized.  Haemophilus influenzae type b (Hib) vaccine. A previously unvaccinated person with asplenia or sickle cell disease or having a scheduled splenectomy should receive 1 dose of Hib vaccine. Regardless of previous immunization, a recipient of a hematopoietic stem cell transplant should receive a  3-dose series 6-12 months after her successful transplant. Hib vaccine is not recommended for adults with HIV infection. Preventive Services / Frequency Ages 35 to 4 years  Blood pressure check.** / Every 3-5 years.  Lipid and cholesterol check.** / Every 5 years beginning at age 60.  Clinical breast exam.** / Every 3 years for women in their 71s and 10s.  BRCA-related cancer risk assessment.** / For women who have family members with a BRCA-related cancer (breast, ovarian, tubal, or peritoneal cancers).  Pap test.** / Every 2 years from ages 76 through 26. Every 3 years starting at age 61 through age 76 or 93 with a history of 3 consecutive normal Pap tests.  HPV screening.** / Every 3 years from ages 37 through ages 60 to 51 with a history of 3 consecutive normal Pap tests.  Hepatitis C blood test.** / For any individual with known risks for hepatitis C.  Skin self-exam. / Monthly.  Influenza vaccine. / Every year.  Tetanus, diphtheria, and acellular pertussis (Tdap, Td) vaccine.** / Consult your health care provider. Pregnant women should receive 1 dose of Tdap vaccine during each pregnancy. 1 dose of Td every 10 years.  Varicella vaccine.** / Consult your health care provider. Pregnant females who do not have evidence of immunity should receive the first dose after pregnancy.  HPV vaccine. / 3 doses over 6 months, if 93 and younger. The vaccine is not recommended for use in pregnant females. However, pregnancy testing is not needed before receiving a dose.  Measles, mumps, rubella (MMR) vaccine.** / You need at least 1 dose of MMR if you were born in 1957 or later. You may also need a 2nd dose. For females of childbearing age, rubella immunity should be determined. If there is no evidence of immunity, females who are not pregnant should be vaccinated. If there is no evidence of immunity, females who are  pregnant should delay immunization until after pregnancy.  Pneumococcal  13-valent conjugate (PCV13) vaccine.** / Consult your health care provider.  Pneumococcal polysaccharide (PPSV23) vaccine.** / 1 to 2 doses if you smoke cigarettes or if you have certain conditions.  Meningococcal vaccine.** / 1 dose if you are age 68 to 8 years and a Market researcher living in a residence hall, or have one of several medical conditions, you need to get vaccinated against meningococcal disease. You may also need additional booster doses.  Hepatitis A vaccine.** / Consult your health care provider.  Hepatitis B vaccine.** / Consult your health care provider.  Haemophilus influenzae type b (Hib) vaccine.** / Consult your health care provider. Ages 7 to 53 years  Blood pressure check.** / Every year.  Lipid and cholesterol check.** / Every 5 years beginning at age 25 years.  Lung cancer screening. / Every year if you are aged 11-80 years and have a 30-pack-year history of smoking and currently smoke or have quit within the past 15 years. Yearly screening is stopped once you have quit smoking for at least 15 years or develop a health problem that would prevent you from having lung cancer treatment.  Clinical breast exam.** / Every year after age 48 years.  BRCA-related cancer risk assessment.** / For women who have family members with a BRCA-related cancer (breast, ovarian, tubal, or peritoneal cancers).  Mammogram.** / Every year beginning at age 41 years and continuing for as long as you are in good health. Consult with your health care provider.  Pap test.** / Every 3 years starting at age 65 years through age 37 or 70 years with a history of 3 consecutive normal Pap tests.  HPV screening.** / Every 3 years from ages 72 years through ages 60 to 40 years with a history of 3 consecutive normal Pap tests.  Fecal occult blood test (FOBT) of stool. / Every year beginning at age 21 years and continuing until age 5 years. You may not need to do this test if you get  a colonoscopy every 10 years.  Flexible sigmoidoscopy or colonoscopy.** / Every 5 years for a flexible sigmoidoscopy or every 10 years for a colonoscopy beginning at age 35 years and continuing until age 48 years.  Hepatitis C blood test.** / For all people born from [birth year] through 1965 and any individual with known risks for hepatitis C.  Skin self-exam. / Monthly.  Influenza vaccine. / Every year.  Tetanus, diphtheria, and acellular pertussis (Tdap/Td) vaccine.** / Consult your health care provider. Pregnant women should receive 1 dose of Tdap vaccine during each pregnancy. 1 dose of Td every 10 years.  Varicella vaccine.** / Consult your health care provider. Pregnant females who do not have evidence of immunity should receive the first dose after pregnancy.  Zoster vaccine.** / 1 dose for adults aged 30 years or older.  Measles, mumps, rubella (MMR) vaccine.** / You need at least 1 dose of MMR if you were born in 1957 or later. You may also need a second dose. For females of childbearing age, rubella immunity should be determined. If there is no evidence of immunity, females who are not pregnant should be vaccinated. If there is no evidence of immunity, females who are pregnant should delay immunization until after pregnancy.  Pneumococcal 13-valent conjugate (PCV13) vaccine.** / Consult your health care provider.  Pneumococcal polysaccharide (PPSV23) vaccine.** / 1 to 2 doses if you smoke cigarettes or if you have certain conditions.  Meningococcal vaccine.** /  Consult your health care provider.  Hepatitis A vaccine.** / Consult your health care provider.  Hepatitis B vaccine.** / Consult your health care provider.  Haemophilus influenzae type b (Hib) vaccine.** / Consult your health care provider. Ages 64 years and over  Blood pressure check.** / Every year.  Lipid and cholesterol check.** / Every 5 years beginning at age 23 years.  Lung cancer screening. / Every year if you  are aged 16-80 years and have a 30-pack-year history of smoking and currently smoke or have quit within the past 15 years. Yearly screening is stopped once you have quit smoking for at least 15 years or develop a health problem that would prevent you from having lung cancer treatment.  Clinical breast exam.** / Every year after age 74 years.  BRCA-related cancer risk assessment.** / For women who have family members with a BRCA-related cancer (breast, ovarian, tubal, or peritoneal cancers).  Mammogram.** / Every year beginning at age 44 years and continuing for as long as you are in good health. Consult with your health care provider.  Pap test.** / Every 3 years starting at age 58 years through age 22 or 39 years with 3 consecutive normal Pap tests. Testing can be stopped between 65 and 70 years with 3 consecutive normal Pap tests and no abnormal Pap or HPV tests in the past 10 years.  HPV screening.** / Every 3 years from ages 64 years through ages 70 or 61 years with a history of 3 consecutive normal Pap tests. Testing can be stopped between 65 and 70 years with 3 consecutive normal Pap tests and no abnormal Pap or HPV tests in the past 10 years.  Fecal occult blood test (FOBT) of stool. / Every year beginning at age 40 years and continuing until age 27 years. You may not need to do this test if you get a colonoscopy every 10 years.  Flexible sigmoidoscopy or colonoscopy.** / Every 5 years for a flexible sigmoidoscopy or every 10 years for a colonoscopy beginning at age 7 years and continuing until age 32 years.  Hepatitis C blood test.** / For all people born from [birth year] through 1963 and any individual with known risks for hepatitis C.  Osteoporosis screening.** / A one-time screening for women ages 30 years and over and women at risk for fractures or osteoporosis.  Skin self-exam. / Monthly.  Influenza vaccine. / Every year.  Tetanus, diphtheria, and acellular pertussis (Tdap/Td)  vaccine.** / 1 dose of Td every 10 years.  Varicella vaccine.** / Consult your health care provider.  Zoster vaccine.** / 1 dose for adults aged 35 years or older.  Pneumococcal 13-valent conjugate (PCV13) vaccine.** / Consult your health care provider.  Pneumococcal polysaccharide (PPSV23) vaccine.** / 1 dose for all adults aged 46 years and older.  Meningococcal vaccine.** / Consult your health care provider.  Hepatitis A vaccine.** / Consult your health care provider.  Hepatitis B vaccine.** / Consult your health care provider.  Haemophilus influenzae type b (Hib) vaccine.** / Consult your health care provider. ** Family history and personal history of risk and conditions may change your health care provider's recommendations.   This information is not intended to replace advice given to you by your health care provider. Make sure you discuss any questions you have with your health care provider.   Document Released: 02/23/2001 Document Revised: 01/18/2014 Document Reviewed: 05/25/2010 Elsevier Interactive Patient Education [employer].

## 2015-03-20 NOTE — Progress Notes (Signed)
Pre visit review using our clinic review tool, if applicable. No additional management support is needed unless otherwise documented below in the visit note. 

## 2015-03-20 NOTE — Assessment & Plan Note (Signed)
Patient encouraged to maintain heart healthy diet, regular exercise, adequate sleep. Consider daily probiotics. Take medications as prescribed. Given and reviewed copy of ACP documents from [place] Secretary of State and encouraged to complete and return 

## 2015-03-22 ENCOUNTER — Encounter: Payer: Self-pay | Admitting: Family Medicine

## 2015-03-30 ENCOUNTER — Encounter: Payer: Self-pay | Admitting: Family Medicine

## 2015-03-30 DIAGNOSIS — G43909 Migraine, unspecified, not intractable, without status migrainosus: Secondary | ICD-10-CM | POA: Insufficient documentation

## 2015-03-30 HISTORY — DX: Migraine, unspecified, not intractable, without status migrainosus: G43.909

## 2015-03-30 NOTE — Assessment & Plan Note (Signed)
Infrequent, responds to Maxalt. Encouraged increased hydration, 64 ounces of clear fluids daily. Minimize alcohol and caffeine. Eat small frequent meals with lean proteins and complex carbs. Avoid high and low blood sugars. Get adequate sleep, 7-8 hours a night. Needs exercise daily preferably in the morning.

## 2015-03-30 NOTE — Progress Notes (Signed)
Patient ID: Alicia Parrish, female   DOB: 18-Apr-1960, 55 y.o.   MRN: [id number]   Subjective:    Patient ID: Alicia Parrish, female    DOB: 08-Feb-1960, 55 y.o.   MRN: [id number]  Chief Complaint  Patient presents with  . Annual Exam    HPI Patient is in today for annual exam. No recent illness. Follows with GYN for paps. No GYN concerns. Is not having significant trouble with migraines. Responds to Maxalt. Denies CP/palp/SOB/HA/congestion/fevers/GI or GU c/o. Taking meds as prescribed  Past Medical History  Diagnosis Date  . Medical history non-contributory   . Headache(784.0)   . Preventative health care 03/20/2015  . Hyperlipidemia, mixed 03/20/2015  . History of chicken pox   . Migraine 03/30/2015    Past Surgical History  Procedure Laterality Date  . Dilation and curettage of uterus  2012  . Trigger finger release Right 02/25/2012    Procedure: MINOR RELEASE TRIGGER FINGER/A-1 PULLEY;  Surgeon: Harvie Junior, MD;  Location: [facility];  Service: Orthopedics;  Laterality: Right;    Family History  Problem Relation Age of Onset  . Cancer Mother     uterus, colon  . Hypertension Mother   . Cancer Father     prostate  . Hypertension Father   . Heart disease Maternal Grandmother   . Other Maternal Grandfather     killed in shooting  . Alzheimer's disease Paternal Grandmother     old age  . Emphysema Paternal Grandfather     Social History   Social History  . Marital Status: Married    Spouse Name: N/A  . Number of Children: N/A  . Years of Education: N/A   Occupational History  . Not on file.   Social History Main Topics  . Smoking status: Never Smoker   . Smokeless tobacco: Never Used  . Alcohol Use: 0.6 oz/week    0 Glasses of wine, 1 Cans of beer, 0 Shots of liquor per week     Comment: rare  . Drug Use: No  . Sexual Activity: Not on file     Comment: lives with husband, dental receptionist/manager, no major dietary restrictions   Other Topics  Concern  . Not on file   Social History Narrative    Outpatient Prescriptions Prior to Visit  Medication Sig Dispense Refill  . calcium carbonate (OS-CAL) 600 MG TABS Take 600 mg by mouth 2 (two) times daily with a meal.    . NON FORMULARY Take 1 tablet by mouth daily. Isotonix OP-3    . NON FORMULARY Take 1 tablet by mouth daily. Calcium Plus    . NON FORMULARY Take 500 mg by mouth daily. Super Colon Cleanse    . rizatriptan (MAXALT) 10 MG tablet Take 10 mg by mouth as needed for migraine. May repeat in 2 hours if needed    . zinc gluconate 50 MG tablet Take 50 mg by mouth daily.    . fluocinonide cream (LIDEX) 0.05 % Apply 1 application topically 3 (three) times daily.    . NON FORMULARY Take 1 tablet by mouth daily. Reported on 03/20/2015     No facility-administered medications prior to visit.    No Known Allergies  Review of Systems  Constitutional: Negative for fever, chills and malaise/fatigue.  HENT: Negative for congestion and hearing loss.   Eyes: Negative for discharge.  Respiratory: Negative for cough, sputum production and shortness of breath.   Cardiovascular: Negative for chest pain, palpitations and leg  swelling.  Gastrointestinal: Negative for heartburn, nausea, vomiting, abdominal pain, diarrhea, constipation and blood in stool.  Genitourinary: Negative for dysuria, urgency, frequency and hematuria.  Musculoskeletal: Negative for myalgias, back pain and falls.  Skin: Negative for rash.  Neurological: Positive for headaches. Negative for dizziness, sensory change, loss of consciousness and weakness.  Endo/Heme/Allergies: Negative for environmental allergies. Does not bruise/bleed easily.  Psychiatric/Behavioral: Negative for depression and suicidal ideas. The patient is not nervous/anxious and does not have insomnia.        Objective:    Physical Exam  Constitutional: She is oriented to person, place, and time. She appears well-developed and well-nourished. No  distress.  HENT:  Head: Normocephalic and atraumatic.  Nose: Nose normal.  Eyes: Right eye exhibits no discharge. Left eye exhibits no discharge.  Neck: Normal range of motion. Neck supple. No thyromegaly present.  Cardiovascular: Normal rate and regular rhythm.   No murmur heard. Pulmonary/Chest: Effort normal and breath sounds normal.  Abdominal: Soft. Bowel sounds are normal. She exhibits no mass. There is no tenderness. There is no rebound and no guarding.  Musculoskeletal: She exhibits no edema.  Lymphadenopathy:    She has no cervical adenopathy.  Neurological: She is alert and oriented to person, place, and time.  Skin: Skin is warm and dry.  Psychiatric: She has a normal mood and affect.  Nursing note and vitals reviewed.   BP 108/60 mmHg  Pulse 82  Temp(Src) 97.6 F (36.4 C) (Oral)  Ht 5' 4.5" (1.638 m)  Wt 109 lb (49.442 kg)  BMI 18.43 kg/m2  SpO2 97% Wt Readings from Last 3 Encounters:  03/20/15 109 lb (49.442 kg)  08/24/12 105 lb 6.4 oz (47.809 kg)  05/18/12 109 lb 14.4 oz (49.85 kg)     Lab Results  Component Value Date   WBC 7.6 08/24/2012   HGB 13.4 08/24/2012   HCT 40.0 08/24/2012   PLT 199.0 08/24/2012   GLUCOSE 80 08/24/2012   CHOL 221* 08/24/2012   TRIG 65.0 08/24/2012   HDL 87.60 08/24/2012   LDLDIRECT 117.6 08/24/2012   ALT 18 08/24/2012   AST 20 08/24/2012   NA 138 08/24/2012   K 4.1 08/24/2012   CL 102 08/24/2012   CREATININE 0.7 08/24/2012   BUN 13 08/24/2012   CO2 30 08/24/2012   TSH 0.70 08/24/2012    Lab Results  Component Value Date   TSH 0.70 08/24/2012   Lab Results  Component Value Date   WBC 7.6 08/24/2012   HGB 13.4 08/24/2012   HCT 40.0 08/24/2012   MCV 88.7 08/24/2012   PLT 199.0 08/24/2012   Lab Results  Component Value Date   NA 138 08/24/2012   K 4.1 08/24/2012   CO2 30 08/24/2012   GLUCOSE 80 08/24/2012   BUN 13 08/24/2012   CREATININE 0.7 08/24/2012   BILITOT 0.6 08/24/2012   ALKPHOS 75 08/24/2012    AST 20 08/24/2012   ALT 18 08/24/2012   PROT 8.0 08/24/2012   ALBUMIN 4.6 08/24/2012   CALCIUM 10.0 08/24/2012   GFR 89.59 08/24/2012   Lab Results  Component Value Date   CHOL 221* 08/24/2012   Lab Results  Component Value Date   HDL 87.60 08/24/2012   No results found for: [facility] Lab Results  Component Value Date   TRIG 65.0 08/24/2012   Lab Results  Component Value Date   CHOLHDL 3 08/24/2012   No results found for: HGBA1C     Assessment & Plan:   Problem  List Items Addressed This Visit    Hyperlipidemia, mixed    Encouraged heart healthy diet, increase exercise, avoid trans fats, consider a krill oil cap daily      Migraine    Infrequent, responds to Maxalt. Encouraged increased hydration, 64 ounces of clear fluids daily. Minimize alcohol and caffeine. Eat small frequent meals with lean proteins and complex carbs. Avoid high and low blood sugars. Get adequate sleep, 7-8 hours a night. Needs exercise daily preferably in the morning.      Preventative health care - Primary    Patient encouraged to maintain heart healthy diet, regular exercise, adequate sleep. Consider daily probiotics. Take medications as prescribed. Given and reviewed copy of ACP documents from [facility] Secretary of State and encouraged to complete and return         I have discontinued Ms. Axley's fluocinonide cream. I am also having her maintain her rizatriptan, zinc gluconate, calcium carbonate, NON FORMULARY, NON FORMULARY, NON FORMULARY, and Biotin.  Meds ordered this encounter  Medications  . Biotin 5 MG CAPS    Sig: Take by mouth.     Danise Edge, MD

## 2015-03-30 NOTE — Assessment & Plan Note (Signed)
Encouraged heart healthy diet, increase exercise, avoid trans fats, consider a krill oil cap daily 

## 2015-03-30 NOTE — Assessment & Plan Note (Signed)
>>  ASSESSMENT AND PLAN FOR MIGRAINE WRITTEN ON 03/30/2015 12:04 PM BY BLYTH, STACEY A, MD  Infrequent, responds to Maxalt. Encouraged increased hydration, 64 ounces of clear fluids daily. Minimize alcohol and caffeine. Eat small frequent meals with lean proteins and complex carbs. Avoid high and low blood sugars. Get adequate sleep, 7-8 hours a night. Needs exercise daily preferably in the morning.

## 2015-06-24 ENCOUNTER — Encounter: Payer: Self-pay | Admitting: Family Medicine

## 2015-07-13 IMAGING — MG MM DIAG BREAST TOMO UNI LEFT
4 series · 4 of 12 positions shown · non-contrast
Comparison: Previous examinations, including the screening
mammogram dated 02/28/2014.

CLINICAL DATA: Possible mass in the upper inner left breast at
recent screening mammography.

EXAM:
DIGITAL DIAGNOSTIC UNILATERAL LEFT MAMMOGRAM WITH 3D TOMOSYNTHESIS
AND CAD

[L ML]
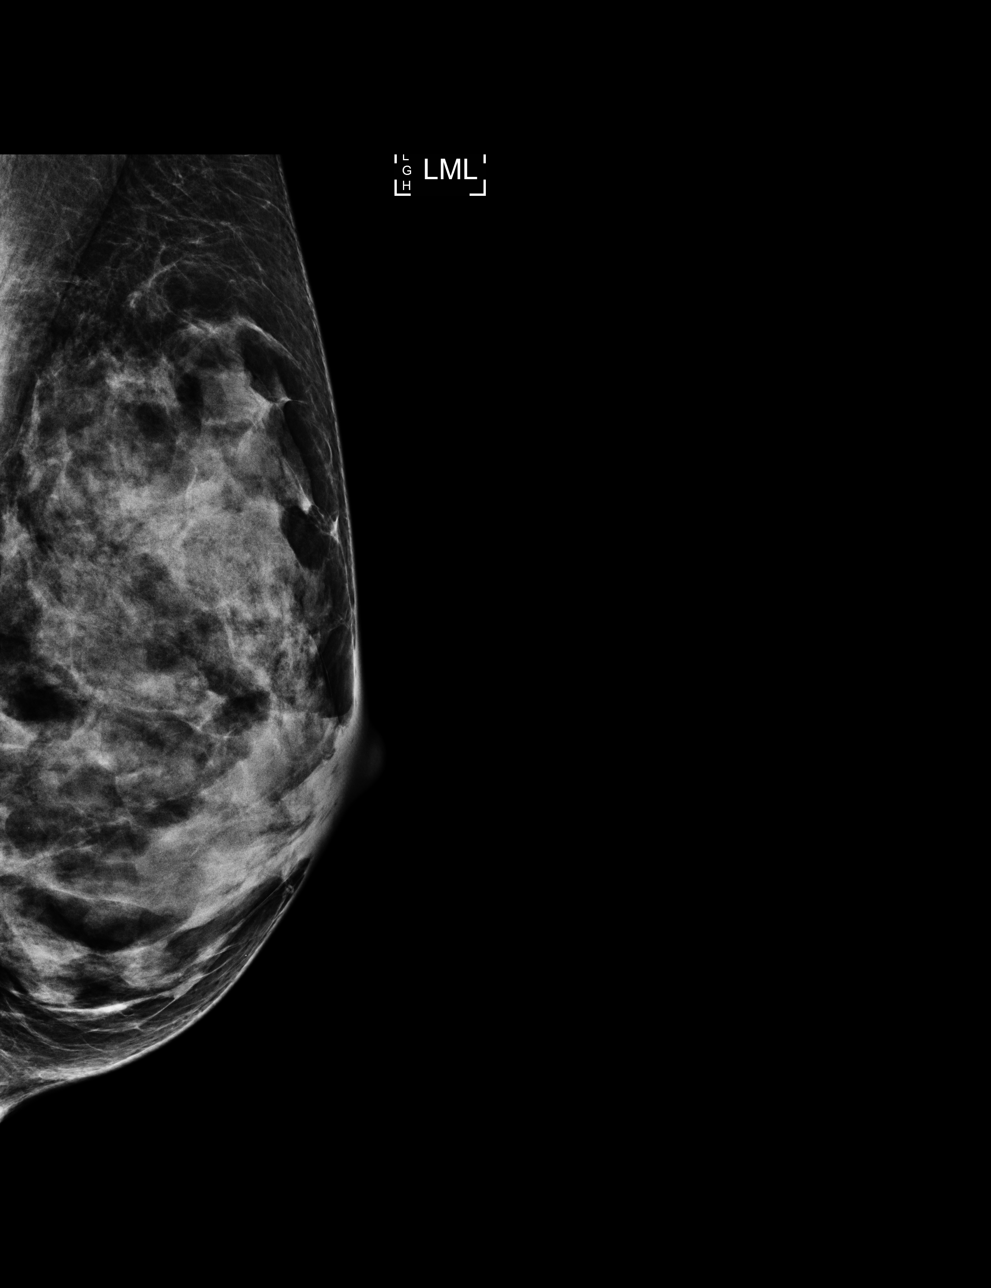

[L CC]
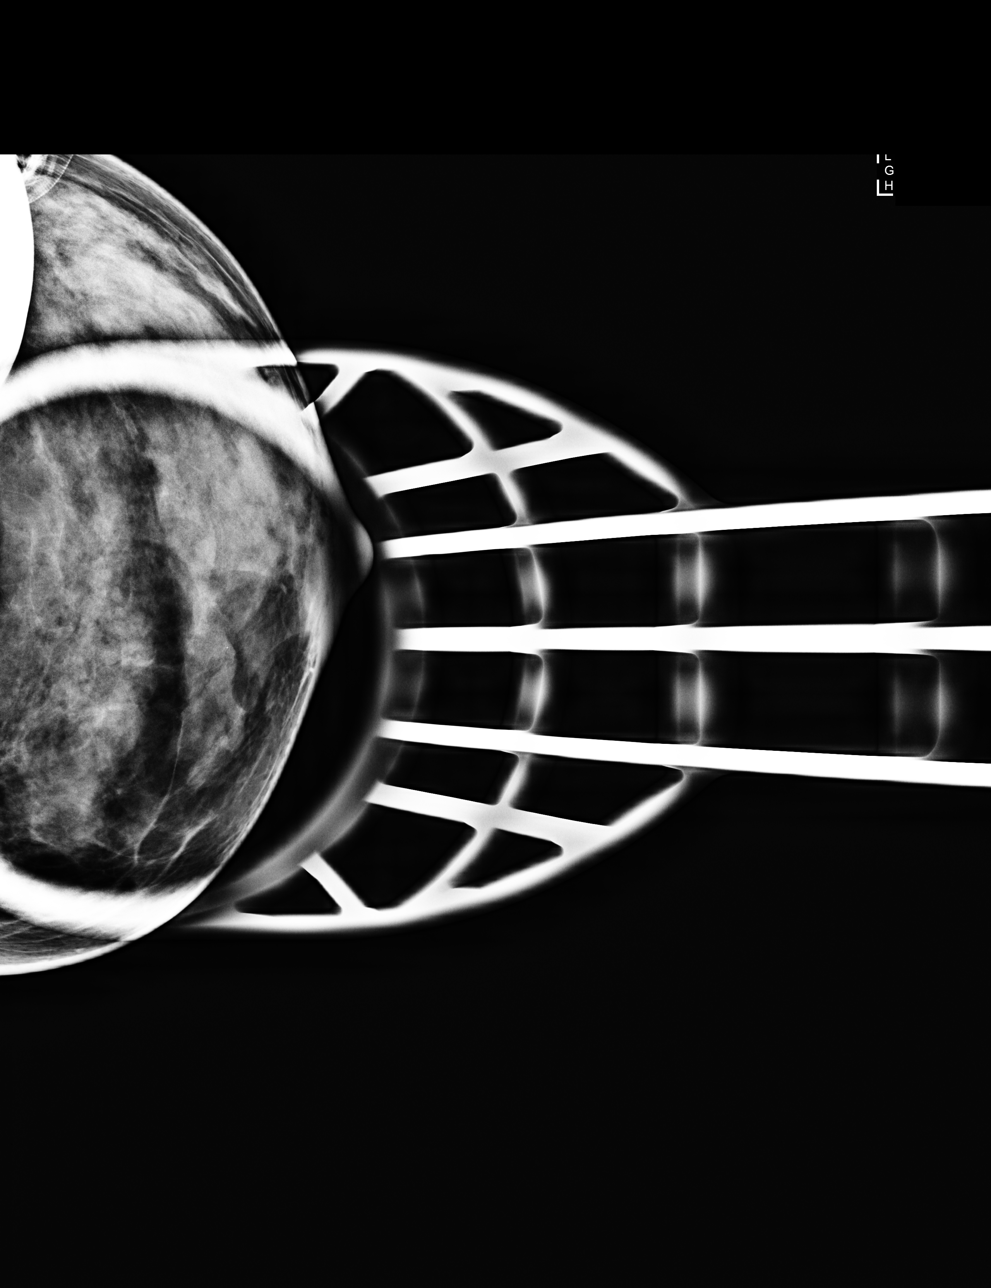

[L CC tomo · tomo slice 24/47.0]
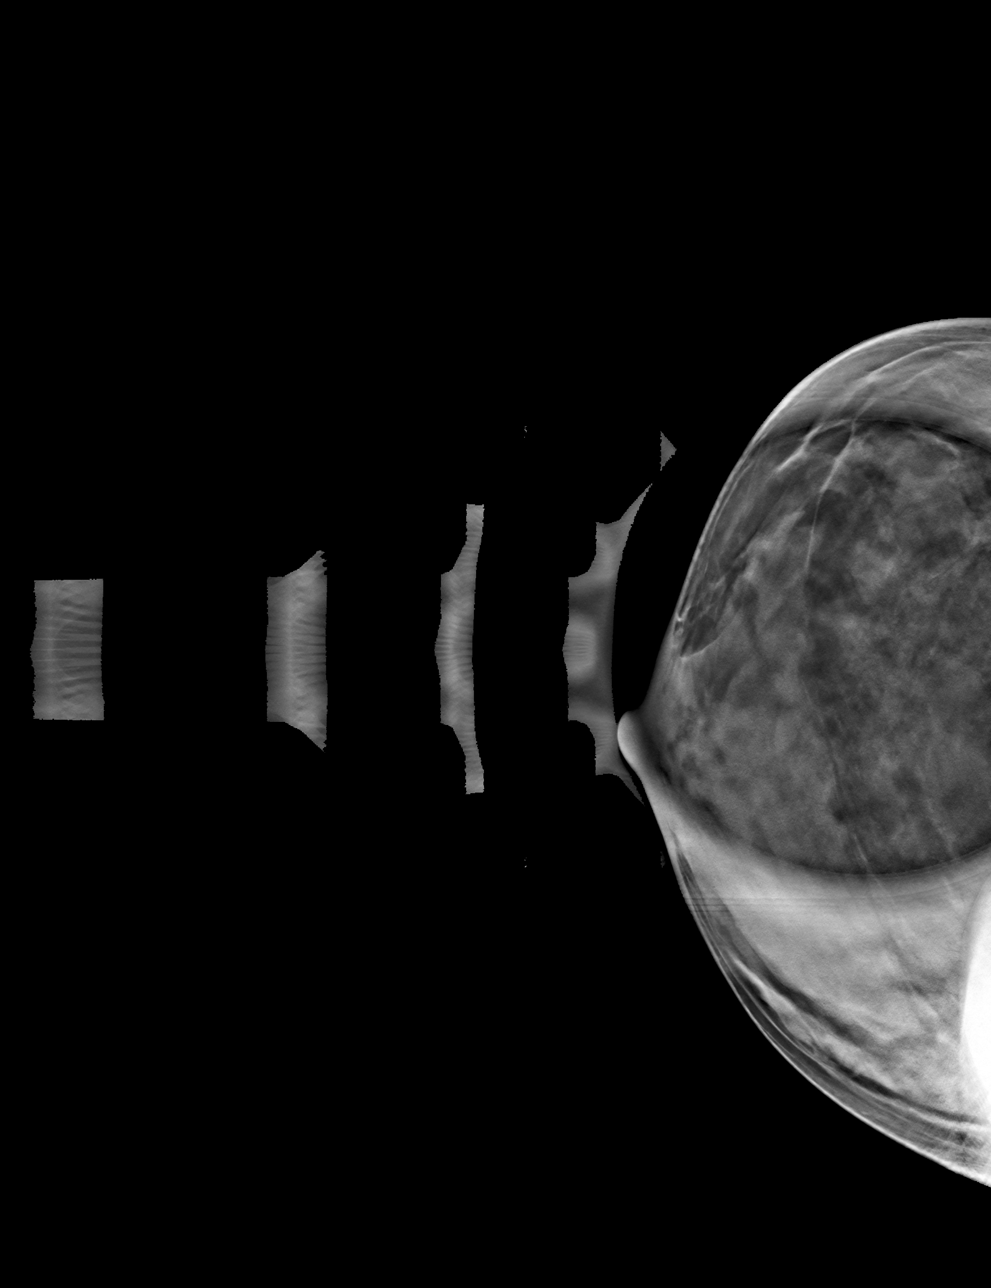

[L ML tomo · tomo slice 23/44.0]
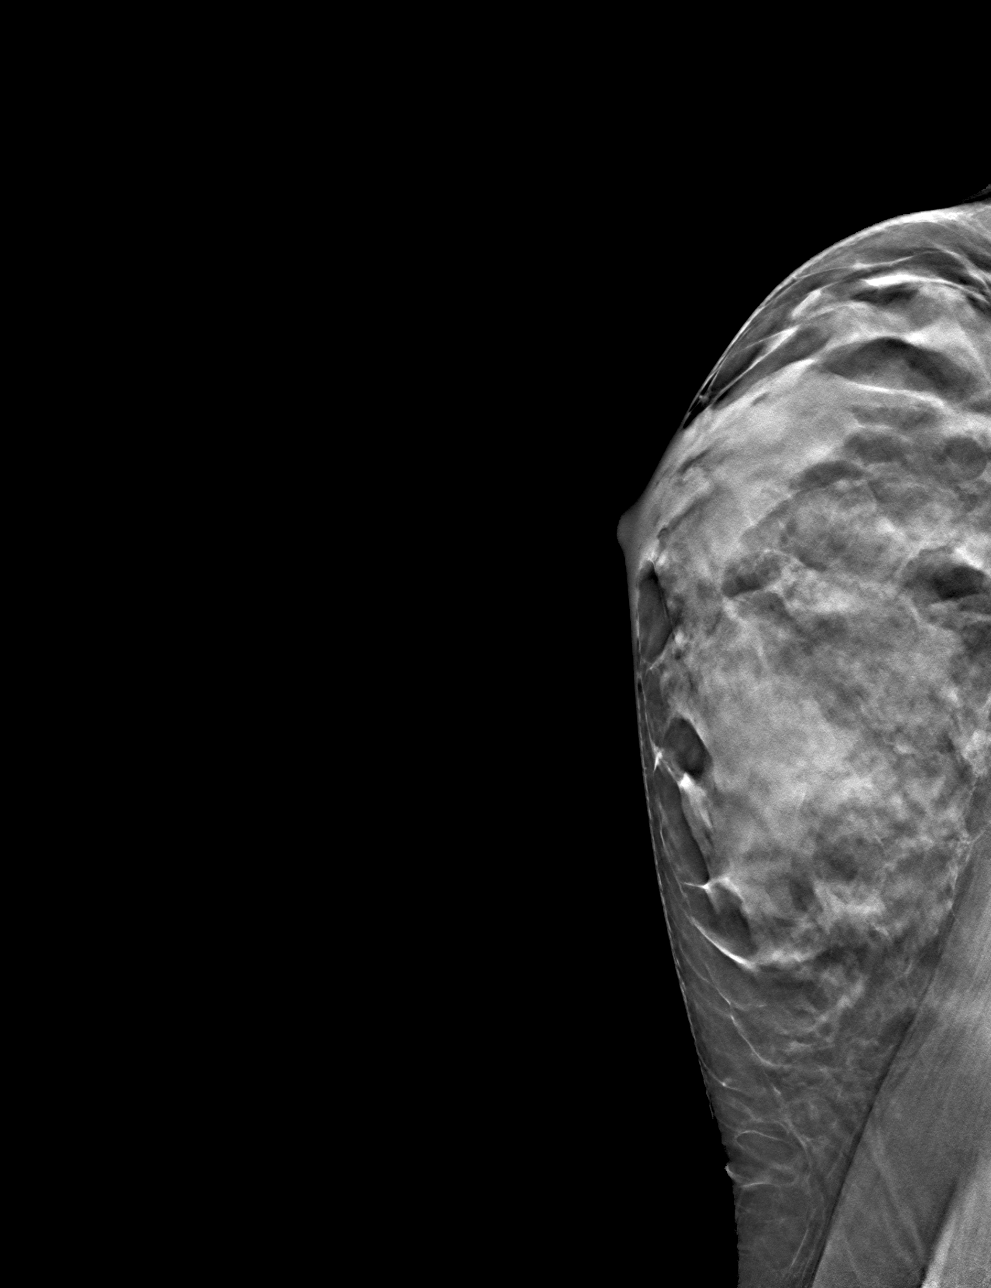

[4 of 12 positions shown; findings below may reference images not displayed]

ACR Breast Density Category c: The breast tissue is heterogeneously
dense, which may obscure small masses.
FINDINGS: 3D tomographic spot compression craniocaudal and 3D tomographic
lateral views of the left breast were obtained. These demonstrate
normal appearing glandular tissue in the upper inner left breast in
the area of recent mammographic concern.

Mammographic images were processed with CAD.
IMPRESSION: No evidence of malignancy. The recently suspected left breast mass
is normal glandular tissue.

RECOMMENDATION:
Bilateral screening mammogram in 1 year.

I have discussed the findings and recommendations with the patient.
Results were also provided in writing at the conclusion of the
visit. If applicable, a reminder letter will be sent to the patient
regarding the next appointment.

BI-RADS CATEGORY  1: Negative.

## 2015-09-11 ENCOUNTER — Encounter: Payer: Self-pay | Admitting: Internal Medicine

## 2015-11-14 ENCOUNTER — Encounter: Payer: Self-pay | Admitting: Internal Medicine

## 2015-11-14 ENCOUNTER — Ambulatory Visit (AMBULATORY_SURGERY_CENTER): Payer: BLUE CROSS/BLUE SHIELD

## 2015-11-14 ENCOUNTER — Telehealth: Payer: Self-pay | Admitting: Internal Medicine

## 2015-11-14 VITALS — Ht 64.5 in | Wt 107.8 lb

## 2015-11-14 DIAGNOSIS — Z1211 Encounter for screening for malignant neoplasm of colon: Secondary | ICD-10-CM

## 2015-11-14 MED ORDER — NA SULFATE-K SULFATE-MG SULF 17.5-3.13-1.6 GM/177ML PO SOLN: ORAL | 0 refills | Status: DC

## 2015-11-14 NOTE — Telephone Encounter (Signed)
Pt states that prep is too expensive and needs another option.  Sample left at front desk for her to pick up

## 2015-11-14 NOTE — Progress Notes (Signed)
Per pt, no allergies to soy or egg products.Pt not taking any weight loss meds or using  O2 at home. 

## 2015-11-24 ENCOUNTER — Encounter: Payer: Self-pay | Admitting: Family Medicine

## 2015-11-25 NOTE — Telephone Encounter (Signed)
Pt called in to follow up on My Chart message sent yesterday.   Please advise.

## 2015-11-26 NOTE — Telephone Encounter (Signed)
[facility] and needs [facility].  [facility] [address] [place], [place]. [phone]

## 2015-11-28 ENCOUNTER — Encounter: Payer: BLUE CROSS/BLUE SHIELD | Admitting: Internal Medicine

## 2016-03-09 ENCOUNTER — Encounter: Payer: Self-pay | Admitting: Family Medicine

## 2016-03-09 NOTE — Telephone Encounter (Addendum)
error:[id number] ° °

## 2016-03-10 ENCOUNTER — Telehealth: Payer: Self-pay | Admitting: Family Medicine

## 2016-03-10 ENCOUNTER — Telehealth: Payer: Self-pay

## 2016-03-10 ENCOUNTER — Ambulatory Visit (INDEPENDENT_AMBULATORY_CARE_PROVIDER_SITE_OTHER): Payer: BLUE CROSS/BLUE SHIELD

## 2016-03-10 DIAGNOSIS — Z23 Encounter for immunization: Secondary | ICD-10-CM | POA: Diagnosis not present

## 2016-03-10 NOTE — Progress Notes (Signed)
Pre visit review using our clinic review tool, if applicable. No additional management support is needed unless otherwise documented below in the visit note.  Patient in clinic today to receive Tdap due to family member recently having a newborn and she requested vaccine prior to visiting newborn. Reviewed possibility of insurance coverage; patient agrees to pay out of pocket if needed.   Injection given to Left deltoid; patient tolerated well; no s/s of reaction prior to leaving clinic.

## 2016-03-10 NOTE — Telephone Encounter (Signed)
Patient is calling to find out if she needs another tetanus shot and when her last tetanus shot was. She is very concerned about this because she is going to visit someone and she needs to have had a tetanus shot to see them... She would like to come in today or tomorrow to get the injection if needed. Please advise   Phone: [phone]

## 2016-03-10 NOTE — Telephone Encounter (Signed)
Left message for patient to return call regardin last Td she received. We do not have record in the system.

## 2016-03-10 NOTE — Telephone Encounter (Signed)
Best # [phone] ( mobile ) please don't call priority # listed in patient chart, patient unsure when she had the last tetanus, please advise as soon as possible

## 2016-03-10 NOTE — Telephone Encounter (Signed)
Patient called in in need of Tdap. No record in chart however patient state she did receive at this practice long before the move to this location. Per Health Maintenance patient is overdue for Tdap. Appointment scheduled for patient.

## 2016-03-10 NOTE — Telephone Encounter (Addendum)
Appointment scheduled for patient to received Tdap today. According to Health Maintanance pt overdue on 05/1981.

## 2016-03-10 NOTE — Telephone Encounter (Signed)
Patient request a call back @ [phone].

## 2016-03-25 ENCOUNTER — Encounter: Payer: BLUE CROSS/BLUE SHIELD | Admitting: Family Medicine

## 2016-05-14 ENCOUNTER — Encounter: Payer: Self-pay | Admitting: Internal Medicine

## 2016-05-14 ENCOUNTER — Ambulatory Visit (AMBULATORY_SURGERY_CENTER): Payer: Self-pay

## 2016-05-14 VITALS — Ht 64.0 in | Wt 109.0 lb

## 2016-05-14 DIAGNOSIS — Z1211 Encounter for screening for malignant neoplasm of colon: Secondary | ICD-10-CM

## 2016-05-14 MED ORDER — NA SULFATE-K SULFATE-MG SULF 17.5-3.13-1.6 GM/177ML PO SOLN: 1.0000 | Freq: Once | ORAL | 0 refills | Status: AC

## 2016-05-14 NOTE — Progress Notes (Signed)
Denies allergies to eggs or soy products. Denies complication of anesthesia or sedation. Denies use of weight loss medication. Denies use of O2.   Emmi instructions declined. Computer broken.

## 2016-05-28 ENCOUNTER — Ambulatory Visit (AMBULATORY_SURGERY_CENTER): Payer: BLUE CROSS/BLUE SHIELD | Admitting: Internal Medicine

## 2016-05-28 ENCOUNTER — Encounter: Payer: Self-pay | Admitting: Internal Medicine

## 2016-05-28 VITALS — BP 104/62 | HR 68 | Temp 98.6°F | Resp 14 | Ht 64.0 in | Wt 109.0 lb

## 2016-05-28 DIAGNOSIS — Z1211 Encounter for screening for malignant neoplasm of colon: Secondary | ICD-10-CM

## 2016-05-28 DIAGNOSIS — Z1212 Encounter for screening for malignant neoplasm of rectum: Secondary | ICD-10-CM | POA: Diagnosis not present

## 2016-05-28 MED ORDER — SODIUM CHLORIDE 0.9 % IV SOLN: 500.0000 mL | INTRAVENOUS | Status: AC

## 2016-05-28 NOTE — Op Note (Signed)
[facility] Patient Name: Alicia Parrish Procedure Date: 05/28/2016 9:31 AM MRN: [id number] Endoscopist: Iva Boop , MD Age: 56 Referring MD:  Date of Birth: 01/14/1960 Gender: Female Account #: [id number] Procedure:                Colonoscopy Indications:              Screening for colorectal malignant neoplasm, This                            is the patient's first colonoscopy Medicines:                Propofol per Anesthesia, Monitored Anesthesia Care Procedure:                Pre-Anesthesia Assessment:                           - Prior to the procedure, a History and Physical                            was performed, and patient medications and                            allergies were reviewed. The patient's tolerance of                            previous anesthesia was also reviewed. The risks                            and benefits of the procedure and the sedation                            options and risks were discussed with the patient.                            All questions were answered, and informed consent                            was obtained. Prior Anticoagulants: The patient has                            taken no previous anticoagulant or antiplatelet                            agents. ASA Grade Assessment: II - A patient with                            mild systemic disease. After reviewing the risks                            and benefits, the patient was deemed in                            satisfactory condition to undergo the procedure.  After obtaining informed consent, the colonoscope                            was passed under direct vision. Throughout the                            procedure, the patient's blood pressure, pulse, and                            oxygen saturations were monitored continuously. The                            Colonoscope was introduced through the anus and                             advanced to the the cecum, identified by                            appendiceal orifice and ileocecal valve. The                            colonoscopy was performed without difficulty. The                            patient tolerated the procedure well. The quality                            of the bowel preparation was excellent. The bowel                            preparation used was SUPREP. The ileocecal valve,                            appendiceal orifice, and rectum were photographed. Scope In: 9:41:31 AM Scope Out: 9:58:15 AM Scope Withdrawal Time: 0 hours 12 minutes 38 seconds  Total Procedure Duration: 0 hours 16 minutes 44 seconds  Findings:                 The perianal and digital rectal examinations were                            normal.                           The entire examined colon appeared normal on direct                            and retroflexion views. Complications:            No immediate complications. Estimated Blood Loss:     Estimated blood loss: none. Impression:               - The entire examined colon is normal on direct and  retroflexion views.                           - No specimens collected. Recommendation:           - Patient has a contact number available for                            emergencies. The signs and symptoms of potential                            delayed complications were discussed with the                            patient. Return to normal activities tomorrow.                            Written discharge instructions were provided to the                            patient.                           - Resume previous diet.                           - Continue present medications.                           - Repeat colonoscopy/other testing in 10 years for                            screening purposes. Iva Booparl E Genisis Sonnier, MD 05/28/2016 10:01:26 AM This report has been signed electronically.

## 2016-05-28 NOTE — Patient Instructions (Addendum)
The colon is normal!  Next routine colonoscopy or other screening test in 10 years - 2028  I appreciate the opportunity to care for you. Iva Boop, MD, FACG  YOU HAD AN ENDOSCOPIC PROCEDURE TODAY AT THE [facility]:   Refer to the procedure report that was given to you for any specific questions about what was found during the examination.  If the procedure report does not answer your questions, please call your gastroenterologist to clarify.  If you requested that your care partner not be given the details of your procedure findings, then the procedure report has been included in a sealed envelope for you to review at your convenience later.  YOU SHOULD EXPECT: Some feelings of bloating in the abdomen. Passage of more gas than usual.  Walking can help get rid of the air that was put into your GI tract during the procedure and reduce the bloating. If you had a lower endoscopy (such as a colonoscopy or flexible sigmoidoscopy) you may notice spotting of blood in your stool or on the toilet paper. If you underwent a bowel prep for your procedure, you may not have a normal bowel movement for a few days.  Please Note:  You might notice some irritation and congestion in your nose or some drainage.  This is from the oxygen used during your procedure.  There is no need for concern and it should clear up in a day or so.  SYMPTOMS TO REPORT IMMEDIATELY:   Following lower endoscopy (colonoscopy or flexible sigmoidoscopy):  Excessive amounts of blood in the stool  Significant tenderness or worsening of abdominal pains  Swelling of the abdomen that is new, acute  Fever of 100F or higher   Following upper endoscopy (EGD)  Vomiting of blood or coffee ground material  New chest pain or pain under the shoulder blades  Painful or persistently difficult swallowing  New shortness of breath  Fever of 100F or higher  Black, tarry-looking stools  For urgent or emergent issues, a  gastroenterologist can be reached at any hour by calling [phone].   DIET:  We do recommend a small meal at first, but then you may proceed to your regular diet.  Drink plenty of fluids but you should avoid alcoholic beverages for 24 hours.  ACTIVITY:  You should plan to take it easy for the rest of today and you should NOT DRIVE or use heavy machinery until tomorrow (because of the sedation medicines used during the test).    FOLLOW UP: Our staff will call the number listed on your records the next business day following your procedure to check on you and address any questions or concerns that you may have regarding the information given to you following your procedure. If we do not reach you, we will leave a message.  However, if you are feeling well and you are not experiencing any problems, there is no need to return our call.  We will assume that you have returned to your regular daily activities without incident.  If any biopsies were taken you will be contacted by phone or by letter within the next 1-3 weeks.  Please call us at [phone] if you have not heard about the biopsies in 3 weeks.    SIGNATURES/CONFIDENTIALITY: You and/or your care partner have signed paperwork which will be entered into your electronic medical record.  These signatures attest to the fact that that the information above on your After Visit Summary has  been reviewed and is understood.  Full responsibility of the confidentiality of this discharge information lies with you and/or your care-partner.YOU HAD AN ENDOSCOPIC PROCEDURE TODAY AT THE [facility]:   Refer to the procedure report that was given to you for any specific questions about what was found during the examination.  If the procedure report does not answer your questions, please call your gastroenterologist to clarify.  If you requested that your care partner not be given the details of your procedure findings, then the procedure report  has been included in a sealed envelope for you to review at your convenience later.  YOU SHOULD EXPECT: Some feelings of bloating in the abdomen. Passage of more gas than usual.  Walking can help get rid of the air that was put into your GI tract during the procedure and reduce the bloating. If you had a lower endoscopy (such as a colonoscopy or flexible sigmoidoscopy) you may notice spotting of blood in your stool or on the toilet paper. If you underwent a bowel prep for your procedure, you may not have a normal bowel movement for a few days.  Please Note:  You might notice some irritation and congestion in your nose or some drainage.  This is from the oxygen used during your procedure.  There is no need for concern and it should clear up in a day or so.  SYMPTOMS TO REPORT IMMEDIATELY:   Following lower endoscopy (colonoscopy or flexible sigmoidoscopy):  Excessive amounts of blood in the stool  Significant tenderness or worsening of abdominal pains  Swelling of the abdomen that is new, acute  Fever of 100F or higher   Following upper endoscopy (EGD)  Vomiting of blood or coffee ground material  New chest pain or pain under the shoulder blades  Painful or persistently difficult swallowing  New shortness of breath  Fever of 100F or higher  Black, tarry-looking stools  For urgent or emergent issues, a gastroenterologist can be reached at any hour by calling [phone].   DIET:  We do recommend a small meal at first, but then you may proceed to your regular diet.  Drink plenty of fluids but you should avoid alcoholic beverages for 24 hours.  ACTIVITY:  You should plan to take it easy for the rest of today and you should NOT DRIVE or use heavy machinery until tomorrow (because of the sedation medicines used during the test).    FOLLOW UP: Our staff will call the number listed on your records the next business day following your procedure to check on you and address any questions or  concerns that you may have regarding the information given to you following your procedure. If we do not reach you, we will leave a message.  However, if you are feeling well and you are not experiencing any problems, there is no need to return our call.  We will assume that you have returned to your regular daily activities without incident.  If any biopsies were taken you will be contacted by phone or by letter within the next 1-3 weeks.  Please call us at [phone] if you have not heard about the biopsies in 3 weeks.    SIGNATURES/CONFIDENTIALITY: You and/or your care partner have signed paperwork which will be entered into your electronic medical record.  These signatures attest to the fact that that the information above on your After Visit Summary has been reviewed and is understood.  Full responsibility of the confidentiality of  this discharge information lies with you and/or your care-partner.  Normal colonoscopy, repeat in in 10 years.

## 2016-05-28 NOTE — Progress Notes (Signed)
Pt's states no medical or surgical changes since previsit or office visit. 

## 2016-05-28 NOTE — Progress Notes (Signed)
Report to PACU, RN, vss, BBS= Clear.  

## 2016-05-31 ENCOUNTER — Telehealth: Payer: Self-pay

## 2016-05-31 NOTE — Telephone Encounter (Signed)
Name identifier, left voicemail, we will call back later today.

## 2016-05-31 NOTE — Telephone Encounter (Signed)
Follow up x 2 left a voicemail.

## 2016-06-17 ENCOUNTER — Encounter: Payer: Self-pay | Admitting: Family Medicine

## 2016-06-17 ENCOUNTER — Ambulatory Visit: Payer: BLUE CROSS/BLUE SHIELD | Admitting: Family Medicine

## 2016-06-17 DIAGNOSIS — E559 Vitamin D deficiency, unspecified: Secondary | ICD-10-CM

## 2016-06-17 DIAGNOSIS — E7889 Other lipoprotein metabolism disorders: Secondary | ICD-10-CM

## 2016-06-17 DIAGNOSIS — G43909 Migraine, unspecified, not intractable, without status migrainosus: Secondary | ICD-10-CM

## 2016-06-17 NOTE — Progress Notes (Signed)
New patient office visit note:  Impression and Recommendations:    1. Vitamin D insufficiency   2. Elevated HDL   3. Migraine without status migrainosus, not intractable, unspecified migraine type     The patient was counseled, risk factors were discussed, anticipatory guidance given.  Gross side effects, risk and benefits, and alternatives of medications discussed with patient.  Patient is aware that all medications have potential side effects and we are unable to predict every side effect or drug-drug interaction that may occur.  Expresses verbal understanding and consents to current therapy plan and treatment regimen.  Return in about 6 months (around 12/17/2016) for compete physical, come fasting; .  Please see AVS handed out to patient at the end of our visit for further patient instructions/ counseling done pertaining to today's office visit.    Note: This document was prepared using Dragon voice recognition software and may include unintentional dictation errors.  ----------------------------------------------------------------------------------------------------------------------    Subjective:    Chief complaint:   Chief Complaint  Patient presents with  . Establish Care     HPI: Alicia Parrish is a pleasant 56 y.o. female who presents to [facility] today to review their medical history with me and establish care.   I asked the patient to review their chronic problem list with me to ensure everything was updated and accurate.    All recent office visits with other providers, any medical records that patient brought in etc  - I reviewed today.     Also asked pt to get me medical records from [facility] providers/ specialists that they had seen within the past 3-5 years- if they are in private practice and/or do not work for a [employer], [facility], [facility], [facility] or [facility] owned practice.  Told them to call their specialists to clarify this if  they are not sure.   ***    Wt Readings from Last 3 Encounters:  06/17/16 107 lb (48.5 kg)  05/28/16 109 lb (49.4 kg)  05/14/16 109 lb (49.4 kg)   BP Readings from Last 3 Encounters:  06/17/16 118/77  05/28/16 104/62  03/20/15 108/60   Pulse Readings from Last 3 Encounters:  06/17/16 71  05/28/16 68  03/20/15 82   BMI Readings from Last 3 Encounters:  06/17/16 18.66 kg/m  05/28/16 18.71 kg/m  05/14/16 18.71 kg/m    Patient Care Team    Relationship Specialty Notifications Start End  Thomasene Lot, DO PCP - General Family Medicine  06/17/16   Francee Piccolo, MD Consulting Physician Ophthalmology  03/19/15   Santiago Glad, MD Referring Physician Neurology  03/20/15   Jodi Geralds, MD Consulting Physician Orthopedic Surgery  06/17/16   Iva Boop, MD Consulting Physician Gastroenterology  06/17/16   Farris Has, MD Referring Physician Dermatology  06/17/16   Candice Camp, MD Consulting Physician Obstetrics and Gynecology  06/17/16     Patient Active Problem List   Diagnosis Date Noted  . Vitamin D insufficiency 06/17/2016  . Elevated HDL 06/17/2016  . Migraine 03/30/2015  . Preventative health care 03/20/2015  . History of chicken pox   . Pigmented skin lesions 05/18/2012     Past Medical History:  Diagnosis Date  . Headache(784.0)   . History of chicken pox   . Hyperlipidemia, mixed 03/20/2015   Patient denies  . Medical history non-contributory   . Migraine 03/30/2015  . Preventative health care 03/20/2015     Past Medical History:  Diagnosis Date  . Headache(784.0)   . History of chicken pox   . Hyperlipidemia, mixed 03/20/2015   Patient denies  . Medical history non-contributory   . Migraine 03/30/2015  . Preventative health care 03/20/2015     Past Surgical History:  Procedure Laterality Date  . DILATION AND CURETTAGE OF UTERUS  2012     Family History  Problem Relation Age of Onset  . Cancer Mother        uterus, colon  .  Hypertension Mother   . Colon cancer Mother   . Cancer Father        prostate  . Hypertension Father   . Heart disease Maternal Grandmother   . Other Maternal Grandfather        killed in shooting  . Alzheimer's disease Paternal Grandmother        old age  . Emphysema Paternal Grandfather   . Esophageal cancer Neg Hx   . Stomach cancer Neg Hx   . Rectal cancer Neg Hx      Social History   Substance and Sexual Activity  Drug Use No     Social History   Substance and Sexual Activity  Alcohol Use Yes  . Alcohol/week: 0.6 oz  . Types: 1 Cans of beer per week   Comment: rare     Social History   Tobacco Use  Smoking Status Never Smoker  Smokeless Tobacco Never Used     Outpatient Encounter Medications as of 06/17/2016  Medication Sig  . Biotin 5 MG CAPS Take 5,000 mg by mouth.   . calcium carbonate (OS-CAL) 600 MG TABS Take 600 mg by mouth 2 (two) times daily with a meal.  . Cholecalciferol (VITAMIN D PO) Take by mouth. Vit d3 5000 units-Take one daily  . NON FORMULARY Take 1 tablet by mouth daily. Isotonix OP-3  . NON FORMULARY Take 1 tablet by mouth daily. Calcium Plus  . OVER THE COUNTER MEDICATION Take 2 capsules by mouth. Vision essentials eye vitamins. 2 capsules daily.  . rizatriptan (MAXALT) 10 MG tablet Take 10 mg by mouth as needed for migraine. May repeat in 2 hours if needed  . zinc gluconate 50 MG tablet Take 50 mg by mouth daily.   Facility-Administered Encounter Medications as of 06/17/2016  Medication  . 0.9 %  sodium chloride infusion    Allergies: Patient has no known allergies.   ROS   Objective:   Blood pressure 118/77, pulse 71, height 5' 3.5" (1.613 m), weight 107 lb (48.5 kg). Body mass index is 18.66 kg/m. General: Well Developed, well nourished, and in no acute distress.  Neuro: Alert and oriented x3, extra-ocular muscles intact, sensation grossly intact.  HEENT:[place]/AT, PERRLA, neck supple, No carotid bruits Skin: no gross rashes    Cardiac: Regular rate and rhythm Respiratory: Essentially clear to auscultation bilaterally. Not using accessory muscles, speaking in full sentences.  Abdominal: not grossly distended Musculoskeletal: Ambulates w/o diff, FROM * 4 ext.  Vasc: less 2 sec cap RF, warm and pink  Psych:  No HI/SI, judgement and insight good, Euthymic mood. Full Affect.    No results found for this or any previous visit (from the past 2160 hour(s)).

## 2016-06-17 NOTE — Patient Instructions (Signed)

## 2016-07-05 NOTE — Progress Notes (Signed)
This encounter was created in error - please disregard.

## 2016-07-16 ENCOUNTER — Encounter: Payer: BLUE CROSS/BLUE SHIELD | Admitting: Family Medicine

## 2016-07-22 ENCOUNTER — Encounter: Payer: BLUE CROSS/BLUE SHIELD | Admitting: Family Medicine

## 2016-11-18 ENCOUNTER — Other Ambulatory Visit: Payer: Self-pay

## 2016-11-18 ENCOUNTER — Other Ambulatory Visit (INDEPENDENT_AMBULATORY_CARE_PROVIDER_SITE_OTHER): Payer: BLUE CROSS/BLUE SHIELD

## 2016-11-18 DIAGNOSIS — Z0000 Encounter for general adult medical examination without abnormal findings: Secondary | ICD-10-CM

## 2016-11-18 DIAGNOSIS — E559 Vitamin D deficiency, unspecified: Secondary | ICD-10-CM

## 2016-11-18 DIAGNOSIS — E7889 Other lipoprotein metabolism disorders: Secondary | ICD-10-CM

## 2016-11-19 ENCOUNTER — Ambulatory Visit (INDEPENDENT_AMBULATORY_CARE_PROVIDER_SITE_OTHER): Payer: BLUE CROSS/BLUE SHIELD | Admitting: Family Medicine

## 2016-11-19 ENCOUNTER — Encounter: Payer: Self-pay | Admitting: Family Medicine

## 2016-11-19 VITALS — BP 112/75 | HR 76 | Ht 63.5 in | Wt 109.5 lb

## 2016-11-19 DIAGNOSIS — Z9111 Patient's noncompliance with dietary regimen: Secondary | ICD-10-CM

## 2016-11-19 DIAGNOSIS — Z0000 Encounter for general adult medical examination without abnormal findings: Secondary | ICD-10-CM

## 2016-11-19 DIAGNOSIS — Z91199 Patient's noncompliance with other medical treatment and regimen due to unspecified reason: Secondary | ICD-10-CM | POA: Insufficient documentation

## 2016-11-19 DIAGNOSIS — E559 Vitamin D deficiency, unspecified: Secondary | ICD-10-CM

## 2016-11-19 DIAGNOSIS — E7889 Other lipoprotein metabolism disorders: Secondary | ICD-10-CM

## 2016-11-19 NOTE — Patient Instructions (Addendum)
We will call you with the results of your blood work or contact you via my chart.  Otherwise unless needed we will not bring you back into the office.   Do weightbearing exercises for a total of 30 minutes at least 5 days a week at moderate intensity.  This means that you can just barely carry on a conversation.  Also doing weight lifting 2 days/week will also help minimize your risk of osteoporosis and bone thinning.  -Your colonoscopy was done in May 2018 and it was negative by Dr. Arelia Longest.  You only need a repeat in 10 years.  Your Pap smear was done in December 15, 2015 and it was negative for lesion or malignancy.  You would need a repeat in 3 years with your gynecologist.  -For your bone density, per your recollection was done around 2015 and it was normal so that usually done every 2 years and somebody nice and thinner such as yourself I would recommend that be repeated every 2 years to your gynecologist office.  -Then make sure you are getting your mammograms done every year.  Last time was March 2018.  So that is yearly as well.  Preventive Care for Adults, Female  A healthy lifestyle and preventive care can promote health and wellness. Preventive health guidelines for women include the following key practices.   A routine yearly physical is a good way to check with your health care provider about your health and preventive screening. It is a chance to share any concerns and updates on your health and to receive a thorough exam.   Visit your dentist for a routine exam and preventive care every 6 months. Brush your teeth twice a day and floss once a day. Good oral hygiene prevents tooth decay and gum disease.   The frequency of eye exams is based on your age, health, family medical history, use of contact lenses, and other factors. Follow your health care provider's recommendations for frequency of eye exams.   Eat a healthy diet. Foods like vegetables, fruits, whole grains, low-fat  dairy products, and lean protein foods contain the nutrients you need without too many calories. Decrease your intake of foods high in solid fats, added sugars, and salt. Eat the right amount of calories for you.Get information about a proper diet from your health care provider, if necessary.   Regular physical exercise is one of the most important things you can do for your health. Most adults should get at least 150 minutes of moderate-intensity exercise (any activity that increases your heart rate and causes you to sweat) each week. In addition, most adults need muscle-strengthening exercises on 2 or more days a week.   Maintain a healthy weight. The body mass index (BMI) is a screening tool to identify possible weight problems. It provides an estimate of body fat based on height and weight. Your health care provider can find your BMI, and can help you achieve or maintain a healthy weight.For adults 20 years and older:   - A BMI below 18.5 is considered underweight.   - A BMI of 18.5 to 24.9 is normal.   - A BMI of 25 to 29.9 is considered overweight.   - A BMI of 30 and above is considered obese.   Maintain normal blood lipids and cholesterol levels by exercising and minimizing your intake of trans and saturated fats.  Eat a balanced diet with plenty of fruit and vegetables. Blood tests for lipids and cholesterol should begin  at age 42 and be repeated every 5 years minimum.  If your lipid or cholesterol levels are high, you are over 40, or you are at high risk for heart disease, you may need your cholesterol levels checked more frequently.Ongoing high lipid and cholesterol levels should be treated with medicines if diet and exercise are not working.   If you smoke, find out from your health care provider how to quit. If you do not use tobacco, do not start.   Lung cancer screening is recommended for adults aged 16-80 years who are at high risk for developing lung cancer because of a history  of smoking. A yearly low-dose CT scan of the lungs is recommended for people who have at least a 30-pack-year history of smoking and are a current smoker or have quit within the past 15 years. A pack year of smoking is smoking an average of 1 pack of cigarettes a day for 1 year (for example: 1 pack a day for 30 years or 2 packs a day for 15 years). Yearly screening should continue until the smoker has stopped smoking for at least 15 years. Yearly screening should be stopped for people who develop a health problem that would prevent them from having lung cancer treatment.   If you are pregnant, do not drink alcohol. If you are breastfeeding, be very cautious about drinking alcohol. If you are not pregnant and choose to drink alcohol, do not have more than 1 drink per day. One drink is considered to be 12 ounces (355 mL) of beer, 5 ounces (148 mL) of wine, or 1.5 ounces (44 mL) of liquor.   Avoid use of street drugs. Do not share needles with anyone. Ask for help if you need support or instructions about stopping the use of drugs.   High blood pressure causes heart disease and increases the risk of stroke. Your blood pressure should be checked at least yearly.  Ongoing high blood pressure should be treated with medicines if weight loss and exercise do not work.   If you are 20-81 years old, ask your health care provider if you should take aspirin to prevent strokes.   Diabetes screening involves taking a blood sample to check your fasting blood sugar level. This should be done once every 3 years, after age 49, if you are within normal weight and without risk factors for diabetes. Testing should be considered at a younger age or be carried out more frequently if you are overweight and have at least 1 risk factor for diabetes.   Breast cancer screening is essential preventive care for women. You should practice "breast self-awareness."  This means understanding the normal appearance and feel of your  breasts and may include breast self-examination.  Any changes detected, no matter how small, should be reported to a health care provider.  Women in their 38s and 30s should have a clinical breast exam (CBE) by a health care provider as part of a regular health exam every 1 to 3 years.  After age 16, women should have a CBE every year.  Starting at age 20, women should consider having a mammogram (breast X-ray test) every year.  Women who have a family history of breast cancer should talk to their health care provider about genetic screening.  Women at a high risk of breast cancer should talk to their health care providers about having an MRI and a mammogram every year.   -Breast cancer gene (BRCA)-related cancer risk assessment is recommended  for women who have family members with BRCA-related cancers. BRCA-related cancers include breast, ovarian, tubal, and peritoneal cancers. Having family members with these cancers may be associated with an increased risk for harmful changes (mutations) in the breast cancer genes BRCA1 and BRCA2. Results of the assessment will determine the need for genetic counseling and BRCA1 and BRCA2 testing.   The Pap test is a screening test for cervical cancer. A Pap test can show cell changes on the cervix that might become cervical cancer if left untreated. A Pap test is a procedure in which cells are obtained and examined from the lower end of the uterus (cervix).   - Women should have a Pap test starting at age 68.   - Between ages 33 and 78, Pap tests should be repeated every 2 years.   - Beginning at age 64, you should have a Pap test every 3 years as long as the past 3 Pap tests have been normal.   - Some women have medical problems that increase the chance of getting cervical cancer. Talk to your health care provider about these problems. It is especially important to talk to your health care provider if a new problem develops soon after your last Pap test. In these  cases, your health care provider may recommend more frequent screening and Pap tests.   - The above recommendations are the same for women who have or have not gotten the vaccine for human papillomavirus (HPV).   - If you had a hysterectomy for a problem that was not cancer or a condition that could lead to cancer, then you no longer need Pap tests. Even if you no longer need a Pap test, a regular exam is a good idea to make sure no other problems are starting.   - If you are between ages 47 and 43 years, and you have had normal Pap tests going back 10 years, you no longer need Pap tests. Even if you no longer need a Pap test, a regular exam is a good idea to make sure no other problems are starting.   - If you have had past treatment for cervical cancer or a condition that could lead to cancer, you need Pap tests and screening for cancer for at least 20 years after your treatment.   - If Pap tests have been discontinued, risk factors (such as a new sexual partner) need to be reassessed to determine if screening should be resumed.   - The HPV test is an additional test that may be used for cervical cancer screening. The HPV test looks for the virus that can cause the cell changes on the cervix. The cells collected during the Pap test can be tested for HPV. The HPV test could be used to screen women aged 69 years and older, and should be used in women of any age who have unclear Pap test results. After the age of 11, women should have HPV testing at the same frequency as a Pap test.   Colorectal cancer can be detected and often prevented. Most routine colorectal cancer screening begins at the age of 16 years and continues through age 84 years. However, your health care provider may recommend screening at an earlier age if you have risk factors for colon cancer. On a yearly basis, your health care provider may provide home test kits to check for hidden blood in the stool.  Use of a small camera at the  end of a tube, to directly  examine the colon (sigmoidoscopy or colonoscopy), can detect the earliest forms of colorectal cancer. Talk to your health care provider about this at age 3, when routine screening begins. Direct exam of the colon should be repeated every 5 -10 years through age 19 years, unless early forms of pre-cancerous polyps or small growths are found.   People who are at an increased risk for hepatitis B should be screened for this virus. You are considered at high risk for hepatitis B if:  -You were born in a country where hepatitis B occurs often. Talk with your health care provider about which countries are considered high risk.  - Your parents were born in a high-risk country and you have not received a shot to protect against hepatitis B (hepatitis B vaccine).  - You have HIV or AIDS.  - You use needles to inject street drugs.  - You live with, or have sex with, someone who has Hepatitis B.  - You get hemodialysis treatment.  - You take certain medicines for conditions like cancer, organ transplantation, and autoimmune conditions.   Hepatitis C blood testing is recommended for all people born from [birth year] through 1965 and any individual with known risks for hepatitis C.   Practice safe sex. Use condoms and avoid high-risk sexual practices to reduce the spread of sexually transmitted infections (STIs). STIs include gonorrhea, chlamydia, syphilis, trichomonas, herpes, HPV, and human immunodeficiency virus (HIV). Herpes, HIV, and HPV are viral illnesses that have no cure. They can result in disability, cancer, and death. Sexually active women aged 30 years and younger should be checked for chlamydia. Older women with new or multiple partners should also be tested for chlamydia. Testing for other STIs is recommended if you are sexually active and at increased risk.   Osteoporosis is a disease in which the bones lose minerals and strength with aging. This can result in serious bone  fractures or breaks. The risk of osteoporosis can be identified using a bone density scan. Women ages 56 years and over and women at risk for fractures or osteoporosis should discuss screening with their health care providers. Ask your health care provider whether you should take a calcium supplement or vitamin D to There are also several preventive steps women can take to avoid osteoporosis and resulting fractures or to keep osteoporosis from worsening. -->Recommendations include:  Eat a balanced diet high in fruits, vegetables, calcium, and vitamins.  Get enough calcium. The recommended total intake of is 1,200 mg daily; for best absorption, if taking supplements, divide doses into 250-500 mg doses throughout the day. Of the two types of calcium, calcium carbonate is best absorbed when taken with food but calcium citrate can be taken on an empty stomach.  Get enough vitamin D. [employer] and the [place] recommend at least 1,000 IU per day for women age 21 and over who are at risk of vitamin D deficiency. Vitamin D deficiency can be caused by inadequate sun exposure (for example, those who live in [place]).  Avoid alcohol and smoking. Heavy alcohol intake (more than 7 drinks per week) increases the risk of falls and hip fracture and women smokers tend to lose bone more rapidly and have lower bone mass than nonsmokers. Stopping smoking is one of the most important changes women can make to improve their health and decrease risk for disease.  Be physically active every day. Weight-bearing exercise (for example, fast walking, hiking, jogging, and weight training) may strengthen bones or slow the  rate of bone loss that comes with aging. Balancing and muscle-strengthening exercises can reduce the risk of falling and fracture.  Consider therapeutic medications. Currently, several types of effective drugs are available. Healthcare providers can recommend the type most appropriate  for each woman.  Eliminate environmental factors that may contribute to accidents. Falls cause nearly 90% of all osteoporotic fractures, so reducing this risk is an important bone-health strategy. Measures include ample lighting, removing obstructions to walking, using nonskid rugs on floors, and placing mats and/or grab bars in showers.  Be aware of medication side effects. Some common medicines make bones weaker. These include a type of steroid drug called glucocorticoids used for arthritis and asthma, some antiseizure drugs, certain sleeping pills, treatments for endometriosis, and some cancer drugs. An overactive thyroid gland or using too much thyroid hormone for an underactive thyroid can also be a problem. If you are taking these medicines, talk to your doctor about what you can do to help protect your bones.reduce the rate of osteoporosis.    Menopause can be associated with physical symptoms and risks. Hormone replacement therapy is available to decrease symptoms and risks. You should talk to your health care provider about whether hormone replacement therapy is right for you.   Use sunscreen. Apply sunscreen liberally and repeatedly throughout the day. You should seek shade when your shadow is shorter than you. Protect yourself by wearing long sleeves, pants, a wide-brimmed hat, and sunglasses year round, whenever you are outdoors.   Once a month, do a whole body skin exam, using a mirror to look at the skin on your back. Tell your health care provider of new moles, moles that have irregular borders, moles that are larger than a pencil eraser, or moles that have changed in shape or color.   -Stay current with required vaccines (immunizations).   Influenza vaccine. All adults should be immunized every year.  Tetanus, diphtheria, and acellular pertussis (Td, Tdap) vaccine. Pregnant women should receive 1 dose of Tdap vaccine during each pregnancy. The dose should be obtained regardless  of the length of time since the last dose. Immunization is preferred during the 27th 36th week of gestation. An adult who has not previously received Tdap or who does not know her vaccine status should receive 1 dose of Tdap. This initial dose should be followed by tetanus and diphtheria toxoids (Td) booster doses every 10 years. Adults with an unknown or incomplete history of completing a 3-dose immunization series with Td-containing vaccines should begin or complete a primary immunization series including a Tdap dose. Adults should receive a Td booster every 10 years.  Varicella vaccine. An adult without evidence of immunity to varicella should receive 2 doses or a second dose if she has previously received 1 dose. Pregnant females who do not have evidence of immunity should receive the first dose after pregnancy. This first dose should be obtained before leaving the health care facility. The second dose should be obtained 4 8 weeks after the first dose.  Human papillomavirus (HPV) vaccine. Females aged 43 26 years who have not received the vaccine previously should obtain the 3-dose series. The vaccine is not recommended for use in pregnant females. However, pregnancy testing is not needed before receiving a dose. If a female is found to be pregnant after receiving a dose, no treatment is needed. In that case, the remaining doses should be delayed until after the pregnancy. Immunization is recommended for any person with an immunocompromised condition through the age of  26 years if she did not get any or all doses earlier. During the 3-dose series, the second dose should be obtained 4 8 weeks after the first dose. The third dose should be obtained 24 weeks after the first dose and 16 weeks after the second dose.  Zoster vaccine. One dose is recommended for adults aged 8 years or older unless certain conditions are present.  Measles, mumps, and rubella (MMR) vaccine. Adults born before [birth year] generally are  considered immune to measles and mumps. Adults born in [birth year] or later should have 1 or more doses of MMR vaccine unless there is a contraindication to the vaccine or there is laboratory evidence of immunity to each of the three diseases. A routine second dose of MMR vaccine should be obtained at least 28 days after the first dose for students attending postsecondary schools, health care workers, or international travelers. People who received inactivated measles vaccine or an unknown type of measles vaccine during 1963 1967 should receive 2 doses of MMR vaccine. People who received inactivated mumps vaccine or an unknown type of mumps vaccine before 1979 and are at high risk for mumps infection should consider immunization with 2 doses of MMR vaccine. For females of childbearing age, rubella immunity should be determined. If there is no evidence of immunity, females who are not pregnant should be vaccinated. If there is no evidence of immunity, females who are pregnant should delay immunization until after pregnancy. Unvaccinated health care workers born before [birth year] who lack laboratory evidence of measles, mumps, or rubella immunity or laboratory confirmation of disease should consider measles and mumps immunization with 2 doses of MMR vaccine or rubella immunization with 1 dose of MMR vaccine.  Pneumococcal 13-valent conjugate (PCV13) vaccine. When indicated, a person who is uncertain of her immunization history and has no record of immunization should receive the PCV13 vaccine. An adult aged 38 years or older who has certain medical conditions and has not been previously immunized should receive 1 dose of PCV13 vaccine. This PCV13 should be followed with a dose of pneumococcal polysaccharide (PPSV23) vaccine. The PPSV23 vaccine dose should be obtained at least 8 weeks after the dose of PCV13 vaccine. An adult aged 52 years or older who has certain medical conditions and previously received 1 or more doses of  PPSV23 vaccine should receive 1 dose of PCV13. The PCV13 vaccine dose should be obtained 1 or more years after the last PPSV23 vaccine dose.  Pneumococcal polysaccharide (PPSV23) vaccine. When PCV13 is also indicated, PCV13 should be obtained first. All adults aged 46 years and older should be immunized. An adult younger than age 90 years who has certain medical conditions should be immunized. Any person who resides in a nursing home or long-term care facility should be immunized. An adult smoker should be immunized. People with an immunocompromised condition and certain other conditions should receive both PCV13 and PPSV23 vaccines. People with human immunodeficiency virus (HIV) infection should be immunized as soon as possible after diagnosis. Immunization during chemotherapy or radiation therapy should be avoided. Routine use of PPSV23 vaccine is not recommended for American Indians, [place] Natives, or people younger than 65 years unless there are medical conditions that require PPSV23 vaccine. When indicated, people who have unknown immunization and have no record of immunization should receive PPSV23 vaccine. One-time revaccination 5 years after the first dose of PPSV23 is recommended for people aged 70 64 years who have chronic kidney failure, nephrotic syndrome, asplenia, or immunocompromised conditions. People who received  1 2 doses of PPSV23 before age 29 years should receive another dose of PPSV23 vaccine at age 65 years or later if at least 5 years have passed since the previous dose. Doses of PPSV23 are not needed for people immunized with PPSV23 at or after age 54 years.  Meningococcal vaccine. Adults with asplenia or persistent complement component deficiencies should receive 2 doses of quadrivalent meningococcal conjugate (MenACWY-D) vaccine. The doses should be obtained at least 2 months apart. Microbiologists working with certain meningococcal bacteria, [employer] recruits, people at risk during  an outbreak, and people who travel to or live in countries with a high rate of meningitis should be immunized. A first-year college student up through age 54 years who is living in a residence hall should receive a dose if she did not receive a dose on or after her 16th birthday. Adults who have certain high-risk conditions should receive one or more doses of vaccine.  Hepatitis A vaccine. Adults who wish to be protected from this disease, have certain high-risk conditions, work with hepatitis A-infected animals, work in hepatitis A research labs, or travel to or work in countries with a high rate of hepatitis A should be immunized. Adults who were previously unvaccinated and who anticipate close contact with an international adoptee during the first 60 days after arrival in the [place] from a country with a high rate of hepatitis A should be immunized.  Hepatitis B vaccine.  Adults who wish to be protected from this disease, have certain high-risk conditions, may be exposed to blood or other infectious body fluids, are household contacts or sex partners of hepatitis B positive people, are clients or workers in certain care facilities, or travel to or work in countries with a high rate of hepatitis B should be immunized.  Haemophilus influenzae type b (Hib) vaccine. A previously unvaccinated person with asplenia or sickle cell disease or having a scheduled splenectomy should receive 1 dose of Hib vaccine. Regardless of previous immunization, a recipient of a hematopoietic stem cell transplant should receive a 3-dose series 6 12 months after her successful transplant. Hib vaccine is not recommended for adults with HIV infection.  Preventive Services / Frequency  Ages 36 to 64years  Blood pressure check.** / Every 1 to 2 years.  Lipid and cholesterol check.** / Every 5 years beginning at age 86 years.  Lung cancer screening. / Every year if you are aged 3 80 years and have a 30-pack-year history  of smoking and currently smoke or have quit within the past 15 years. Yearly screening is stopped once you have quit smoking for at least 15 years or develop a health problem that would prevent you from having lung cancer treatment.  Clinical breast exam.** / Every year after age 10 years.  BRCA-related cancer risk assessment.** / For women who have family members with a BRCA-related cancer (breast, ovarian, tubal, or peritoneal cancers).  Mammogram.** / Every year beginning at age 69 years and continuing for as long as you are in good health. Consult with your health care provider.  Pap test.** / Every 3 years starting at age 38 years through age 32 or 98 years with a history of 3 consecutive normal Pap tests.  HPV screening.** / Every 3 years from ages 25 years through ages 56 to 35 years with a history of 3 consecutive normal Pap tests.  Fecal occult blood test (FOBT) of stool. / Every year beginning at age 39 years and continuing until age 13  years. You may not need to do this test if you get a colonoscopy every 10 years.  Flexible sigmoidoscopy or colonoscopy.** / Every 5 years for a flexible sigmoidoscopy or every 10 years for a colonoscopy beginning at age 71 years and continuing until age 83 years.  Hepatitis C blood test.** / For all people born from [birth year] through 1963 and any individual with known risks for hepatitis C.  Skin self-exam. / Monthly.  Influenza vaccine. / Every year.  Tetanus, diphtheria, and acellular pertussis (Tdap/Td) vaccine.** / Consult your health care provider. Pregnant women should receive 1 dose of Tdap vaccine during each pregnancy. 1 dose of Td every 10 years.  Varicella vaccine.** / Consult your health care provider. Pregnant females who do not have evidence of immunity should receive the first dose after pregnancy.  Zoster vaccine.** / 1 dose for adults aged 5 years or older.  Measles, mumps, rubella (MMR) vaccine.** / You need at least 1 dose of MMR  if you were born in 1957 or later. You may also need a 2nd dose. For females of childbearing age, rubella immunity should be determined. If there is no evidence of immunity, females who are not pregnant should be vaccinated. If there is no evidence of immunity, females who are pregnant should delay immunization until after pregnancy.  Pneumococcal 13-valent conjugate (PCV13) vaccine.** / Consult your health care provider.  Pneumococcal polysaccharide (PPSV23) vaccine.** / 1 to 2 doses if you smoke cigarettes or if you have certain conditions.  Meningococcal vaccine.** / Consult your health care provider.  Hepatitis A vaccine.** / Consult your health care provider.  Hepatitis B vaccine.** / Consult your health care provider.  Haemophilus influenzae type b (Hib) vaccine.** / Consult your health care provider.  Ages 86 years and over  Blood pressure check.** / Every 1 to 2 years.  Lipid and cholesterol check.** / Every 5 years beginning at age 49 years.  Lung cancer screening. / Every year if you are aged 60 80 years and have a 30-pack-year history of smoking and currently smoke or have quit within the past 15 years. Yearly screening is stopped once you have quit smoking for at least 15 years or develop a health problem that would prevent you from having lung cancer treatment.  Clinical breast exam.** / Every year after age 59 years.  BRCA-related cancer risk assessment.** / For women who have family members with a BRCA-related cancer (breast, ovarian, tubal, or peritoneal cancers).  Mammogram.** / Every year beginning at age 68 years and continuing for as long as you are in good health. Consult with your health care provider.  Pap test.** / Every 3 years starting at age 63 years through age 38 or 42 years with 3 consecutive normal Pap tests. Testing can be stopped between 65 and 70 years with 3 consecutive normal Pap tests and no abnormal Pap or HPV tests in the past 10 years.  HPV  screening.** / Every 3 years from ages 56 years through ages 59 or 71 years with a history of 3 consecutive normal Pap tests. Testing can be stopped between 65 and 70 years with 3 consecutive normal Pap tests and no abnormal Pap or HPV tests in the past 10 years.  Fecal occult blood test (FOBT) of stool. / Every year beginning at age 71 years and continuing until age 39 years. You may not need to do this test if you get a colonoscopy every 10 years.  Flexible sigmoidoscopy or colonoscopy.** / Every  5 years for a flexible sigmoidoscopy or every 10 years for a colonoscopy beginning at age 37 years and continuing until age 5 years.  Hepatitis C blood test.** / For all people born from [birth year] through 1965 and any individual with known risks for hepatitis C.  Osteoporosis screening.** / A one-time screening for women ages 67 years and over and women at risk for fractures or osteoporosis.  Skin self-exam. / Monthly.  Influenza vaccine. / Every year.  Tetanus, diphtheria, and acellular pertussis (Tdap/Td) vaccine.** / 1 dose of Td every 10 years.  Varicella vaccine.** / Consult your health care provider.  Zoster vaccine.** / 1 dose for adults aged 46 years or older.  Pneumococcal 13-valent conjugate (PCV13) vaccine.** / Consult your health care provider.  Pneumococcal polysaccharide (PPSV23) vaccine.** / 1 dose for all adults aged 53 years and older.  Meningococcal vaccine.** / Consult your health care provider.  Hepatitis A vaccine.** / Consult your health care provider.  Hepatitis B vaccine.** / Consult your health care provider.  Haemophilus influenzae type b (Hib) vaccine.** / Consult your health care provider. ** Family history and personal history of risk and conditions may change your health care provider's recommendations. Document Released: 02/23/2001 Document Revised: 10/18/2012  [facility] Patient Information 2014 [place], [place].   EXERCISE AND DIET:  We recommended that you  start or continue a regular exercise program for good health. Regular exercise means any activity that makes your heart beat faster and makes you sweat.  We recommend exercising at least 30 minutes per day at least 3 days a week, preferably 5.  We also recommend a diet low in fat and sugar / carbohydrates.  Inactivity, poor dietary choices and obesity can cause diabetes, heart attack, stroke, and kidney damage, among others.     ALCOHOL AND SMOKING:  Women should limit their alcohol intake to no more than 7 drinks/beers/glasses of wine (combined, not each!) per week. Moderation of alcohol intake to this level decreases your risk of breast cancer and liver damage.  ( And of course, no recreational drugs are part of a healthy lifestyle.)  Also, you should not be smoking at all or even being exposed to second hand smoke. Most people know smoking can cause cancer, and various heart and lung diseases, but did you know it also contributes to weakening of your bones?  Aging of your skin?  Yellowing of your teeth and nails?   CALCIUM AND VITAMIN D:  Adequate intake of calcium and Vitamin D are recommended.  The recommendations for exact amounts of these supplements seem to change often, but generally speaking 600 mg of calcium (either carbonate or citrate) and 800 units of Vitamin D per day seems prudent. Certain women may benefit from higher intake of Vitamin D.  If you are among these women, your doctor will have told you during your visit.     PAP SMEARS:  Pap smears, to check for cervical cancer or precancers,  have traditionally been done yearly, although recent scientific advances have shown that most women can have pap smears less often.  However, every woman still should have a physical exam from her gynecologist or primary care physician every year. It will include a breast check, inspection of the vulva and vagina to check for abnormal growths or skin changes, a visual exam of the cervix, and then an  exam to evaluate the size and shape of the uterus and ovaries.  And after 54 years of age, a rectal exam is indicated  to check for rectal cancers. We will also provide age appropriate advice regarding health maintenance, like when you should have certain vaccines, screening for sexually transmitted diseases, bone density testing, colonoscopy, mammograms, etc.    MAMMOGRAMS:  All women over 22 years old should have a yearly mammogram. Many facilities now offer a "3D" mammogram, which may cost around $50 extra out of pocket. If possible,  we recommend you accept the option to have the 3D mammogram performed.  It both reduces the number of women who will be called back for extra views which then turn out to be normal, and it is better than the routine mammogram at detecting truly abnormal areas.     COLONOSCOPY:  Colonoscopy to screen for colon cancer is recommended for all women at age 7.  We know, you hate the idea of the prep.  We agree, BUT, having colon cancer and not knowing it is worse!!  Colon cancer so often starts as a polyp that can be seen and removed at colonscopy, which can quite literally save your life!  And if your first colonoscopy is normal and you have no family history of colon cancer, most women don't have to have it again for 10 years.  Once every ten years, you can do something that may end up saving your life, right?  We will be happy to help you get it scheduled when you are ready.  Be sure to check your insurance coverage so you understand how much it will cost.  It may be covered as a preventative service at no cost, but you should check your particular policy.

## 2016-11-19 NOTE — Progress Notes (Signed)
Impression and Recommendations:    1. Encounter for wellness examination   2. Preventative health care   3. Vitamin D insufficiency   4. Elevated HDL   5. Noncompliance with therapeutic plan     -We had extensive discussion at the office visit today regarding patient expectations and time it takes just to "check a patient in ", workup patient up, discharge of patient etc.  Explained to patient is important that she understand the process so that her expectations could be realistic when it comes to her medical care.  She told me she really appreciated my level of concern and "listening to her concerns ".  She tells me that she would rather come and pay to see me than anyone and that we "would be worth it to her."   I advised patient she should call around to various clinics and see what the cash price for care is.  Also advised she should call [facility] and different draw stations to see what cash price for lab draws R.  Told her at least she could make an educated decision and feel better about her decisions.   Please see orders section below for further details of actions taken during this office visit.  Gross side effects, risk and benefits, and alternatives of medications discussed with patient.  Patient is aware that all medications have potential side effects and we are unable to predict every side effect or drug-drug interaction that may occur.  Expresses verbal understanding and consents to current therapy plan and treatment regiment.  1) Anticipatory Guidance: Discussed importance of wearing a seatbelt while driving, not texting while driving; sunscreen when outside along with yearly skin surveillance; eating a well balanced and modest diet; physical activity at least 25 minutes per day or 150 min/ week of moderate to intense activity.  2) Immunizations / Screenings / Labs:  All immunizations and screenings that patient agrees to, are up-to-date per recommendations or will be  updated today.  Patient understands the needs for q 3mo dental and yearly vision screens which pt will schedule independently. Obtain CBC, CMP, HgA1c, Lipid panel, TSH and vit D when fasting if not already done recently.   3) Weight:   Discussed with patient importance of maintaining her weight.  Her BMI is perfect which will help minimize her risk for various diseases.  Did discuss that she might be at increased risk for osteopenia because of this.  Advised weightbearing exercises and to do weight lifting 2 days/week.  All questions were answered and patient understood.   F-up preventative CPE in 1 year. F/up sooner for chronic care management as discussed and/or prn.   No problem-specific Assessment & Plan notes found for this encounter.    Orders Placed This Encounter  Procedures  . Comprehensive metabolic panel  . Hemoglobin A1c  . Lipid panel  . TSH  . VITAMIN D 25 Hydroxy (Vit-D Deficiency, Fractures)  . CBC with Differential/Platelet     No orders of the defined types were placed in this encounter.    This SmartLink is deprecated. Use AVSMEDLIST instead to display the medication list for a patient.    Please see orders placed and AVS handed out to patient at the end of our visit for further patient instructions/ counseling done pertaining to today's office visit.     Subjective:    Chief Complaint  Patient presents with  . Follow-up   CC:   HPI: Alicia Parrish is a 56  y.o. female who presents to [facility] today a yearly health maintenance exam.  Health Maintenance Summary Reviewed and updated, unless pt declines services.  -Patient was seen for the first time at this clinic in June 2018.  She is essentially a healthy woman and was supposed to follow-up for fasting blood work and then to discuss it.  She came into the clinic for fasting blood work yesterday but left after a few minutes because her husband and she were to irritated with  having to wait a little bit of time for the service.  She presents to the clinic today for complete physical exam.  She has a GYN who does most of her care in regards to female care.  She also tells me today that she basically is pain cash for her care.  Her insurance is very poor right now and eventually she will be letting that expire in the new year.  She will no longer have insurance.  This is been a stressor for her and her husband.   She does not want anything done that does not necessarily have to be done.  She declines flu shot.    - pt went to ENT- Dr Jenne Pane recently for feelings of wax in ear.  Went to see him and he spent 5 min or less with her-told her everything was normal and there was no wax in her ears.  Patient is very upset that he charged her 220 dollars for "doing nothing ".      Aspirin:  n/a Colonoscopy:     UTD Tobacco History Reviewed:   Y  Alcohol:    No concerns, no excessive use Exercise Habits:   meeting AHA guidelines STD concerns:   None-monogamous with husband alone.  No new sexual partners.  No concerns. Drug Use:   None Birth control method:   n/a Menses regular:     n/a Lumps or breast concerns:      no Breast Cancer Family History:      No Bone/ DEXA scan:    Per patient was done approximately 3 years ago in 2015 [facility] office.  She will call for repeat.  Health Maintenance  Topic Date Due  . INFLUENZA VACCINE  04/14/2017 (Originally 08/11/2016)  . Hepatitis C Screening  06/13/2017 (Originally November 10, 1960)  . HIV Screening  06/13/2017 (Originally 05/11/1977)  . MAMMOGRAM  07/06/2018  . PAP SMEAR  12/11/2018  . TETANUS/TDAP  03/10/2026  . COLONOSCOPY  05/29/2026     Wt Readings from Last 3 Encounters:  11/19/16 109 lb 8 oz (49.7 kg)  06/17/16 107 lb (48.5 kg)  05/28/16 109 lb (49.4 kg)   BP Readings from Last 3 Encounters:  11/19/16 112/75  06/17/16 118/77  05/28/16 104/62   Pulse Readings from Last 3 Encounters:    11/19/16 76  06/17/16 71  05/28/16 68     Past Medical History:  Diagnosis Date  . Headache(784.0)   . History of chicken pox   . Hyperlipidemia, mixed 03/20/2015   Patient denies  . Medical history non-contributory   . Migraine 03/30/2015  . Preventative health care 03/20/2015      Past Surgical History:  Procedure Laterality Date  . DILATION AND CURETTAGE OF UTERUS  2012      Family History  Problem Relation Age of Onset  . Cancer Mother        uterus, colon  . Hypertension Mother   .  Colon cancer Mother   . Cancer Father        prostate  . Hypertension Father   . Heart disease Maternal Grandmother   . Other Maternal Grandfather        killed in shooting  . Alzheimer's disease Paternal Grandmother        old age  . Emphysema Paternal Grandfather   . Esophageal cancer Neg Hx   . Stomach cancer Neg Hx   . Rectal cancer Neg Hx       Social History   Substance and Sexual Activity  Drug Use No  ,   Social History   Substance and Sexual Activity  Alcohol Use Yes  . Alcohol/week: 0.6 oz  . 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 Biotin 5 MG CAPS Take 5,000 mg by mouth.     . calcium carbonate (OS-CAL) 600 MG TABS Take 600 mg by mouth 2 (two) times daily with a meal.    . Cholecalciferol (VITAMIN D PO) Take by mouth. Vit d3 5000 units-Take one daily    . NON FORMULARY Take 1 tablet by mouth daily. Isotonix OP-3    . NON FORMULARY Take 1 tablet by mouth daily. Calcium Plus    . OVER THE COUNTER MEDICATION Take 2 capsules by mouth. Vision essentials eye vitamins. 2 capsules daily.    .  rizatriptan (MAXALT) 10 MG tablet Take 10 mg by mouth as needed for migraine. May repeat in 2 hours if needed    . zinc gluconate 50 MG tablet Take 50 mg by mouth daily.     Current Facility-Administered Medications on File Prior to Visit  Medication Dose Route Frequency Provider Last Rate Last Dose  . 0.9 %  sodium chloride infusion  500 mL Intravenous Continuous Iva Boop, MD        Allergies: Patient has no known allergies.  Review of Systems: General:   Denies fever, chills, unexplained weight loss.  Optho/Auditory:   Denies visual changes, blurred vision/LOV Respiratory:   Denies SOB, DOE more than baseline levels.  Cardiovascular:   Denies chest pain, palpitations, new onset peripheral edema  Gastrointestinal:   Denies nausea, vomiting, diarrhea.  Genitourinary: Denies dysuria, freq/ urgency, flank pain or discharge from genitals.  Endocrine:     Denies hot or cold intolerance, polyuria, polydipsia. Musculoskeletal:   Denies unexplained myalgias, joint swelling, unexplained arthralgias, gait problems.  Skin:  Denies rash, suspicious lesions Neurological:     Denies dizziness, unexplained weakness, numbness  Psychiatric/Behavioral:   Denies mood changes, suicidal or homicidal ideations, hallucinations    Objective:    Blood pressure 112/75, pulse 76, height 5' 3.5" (1.613 m), weight 109 lb 8 oz (49.7 kg). Body mass index is 19.09 kg/m. General Appearance:    Alert, cooperative, no distress, appears stated age  Head:    Normocephalic, without obvious abnormality, atraumatic  Eyes:    PERRL, conjunctiva/corneas clear, EOM's intact, fundi    benign, both eyes  Ears:    Normal TM's and external ear canals, both ears  Nose:   Nares normal,  septum midline, mucosa normal, no drainage    or sinus tenderness  Throat:   Lips w/o lesion, mucosa moist, and tongue normal; teeth and   gums normal  Neck:   Supple, symmetrical, trachea midline, no adenopathy;    thyroid:  no  enlargement/tenderness/nodules; no carotid   bruit or JVD  Back:     Symmetric, no curvature, ROM normal, no CVA tenderness  Lungs:     Clear to auscultation bilaterally, respirations unlabored, no       Wh/ R/ R  Chest Wall:    No tenderness or gross deformity; normal excursion   Heart:    Regular rate and rhythm, S1 and S2 normal, no murmur, rub   or gallop  Breast Exam:    Deferred to gyn per pt  Abdomen:     Soft, non-tender, bowel sounds active all four quadrants, NO   G/R/R, no masses, no organomegaly  Genitalia:  Deferred to gyn  Rectal:   deferred  Extremities:   Extremities normal, atraumatic, no cyanosis or gross edema  Pulses:   2+ and symmetric all extremities  Skin:   Warm, dry, Skin color, texture, turgor normal, no obvious rashes or lesions Psych: No HI/SI, judgement and insight good, Euthymic mood. Full Affect.  Neurologic:   CNII-XII intact, normal strength, sensation and reflexes    Throughout

## 2016-11-20 LAB — COMPREHENSIVE METABOLIC PANEL
A/G RATIO: 1.6 (ref 1.2–2.2)
ALBUMIN: 4.4 g/dL (ref 3.5–5.5)
ALT: 10 IU/L (ref 0–32)
AST: 16 IU/L (ref 0–40)
Alkaline Phosphatase: 86 IU/L (ref 39–117)
BILIRUBIN TOTAL: 0.3 mg/dL (ref 0.0–1.2)
BUN / CREAT RATIO: 19 (ref 9–23)
BUN: 15 mg/dL (ref 6–24)
CALCIUM: 9.7 mg/dL (ref 8.7–10.2)
CHLORIDE: 103 mmol/L (ref 96–106)
CO2: 26 mmol/L (ref 20–29)
Creatinine, Ser: 0.8 mg/dL (ref 0.57–1.00)
GFR, EST AFRICAN AMERICAN: 97 mL/min/{1.73_m2} (ref >59)
GFR, EST NON AFRICAN AMERICAN: 84 mL/min/{1.73_m2} (ref >59)
GLOBULIN, TOTAL: 2.7 g/dL (ref 1.5–4.5)
Glucose: 82 mg/dL (ref 65–99)
POTASSIUM: 4.5 mmol/L (ref 3.5–5.2)
Sodium: 141 mmol/L (ref 134–144)
TOTAL PROTEIN: 7.1 g/dL (ref 6.0–8.5)

## 2016-11-20 LAB — CBC WITH DIFFERENTIAL/PLATELET
Basophils Absolute: 0 10*3/uL (ref 0.0–0.2)
Basos: 0 %
EOS (ABSOLUTE): 0 10*3/uL (ref 0.0–0.4)
Eos: 1 %
Hematocrit: 39.4 % (ref 34.0–46.6)
Hemoglobin: 13.5 g/dL (ref 11.1–15.9)
IMMATURE GRANS (ABS): 0 10*3/uL (ref 0.0–0.1)
IMMATURE GRANULOCYTES: 0 %
LYMPHS: 30 %
Lymphocytes Absolute: 1.5 10*3/uL (ref 0.7–3.1)
MCH: 29 pg (ref 26.6–33.0)
MCHC: 34.3 g/dL (ref 31.5–35.7)
MCV: 85 fL (ref 79–97)
Monocytes Absolute: 0.3 10*3/uL (ref 0.1–0.9)
Monocytes: 7 %
Neutrophils Absolute: 3.2 10*3/uL (ref 1.4–7.0)
Neutrophils: 62 %
Platelets: 197 10*3/uL (ref 150–379)
RBC: 4.65 x10E6/uL (ref 3.77–5.28)
RDW: 13.8 % (ref 12.3–15.4)
WBC: 5.1 10*3/uL (ref 3.4–10.8)

## 2016-11-20 LAB — LIPID PANEL
CHOLESTEROL TOTAL: 201 mg/dL — AB (ref 100–199)
Chol/HDL Ratio: 2.3 ratio (ref 0.0–4.4)
HDL: 86 mg/dL (ref >39)
LDL CALC: 102 mg/dL — AB (ref 0–99)
TRIGLYCERIDES: 66 mg/dL (ref 0–149)
VLDL CHOLESTEROL CAL: 13 mg/dL (ref 5–40)

## 2016-11-20 LAB — HEMOGLOBIN A1C
Est. average glucose Bld gHb Est-mCnc: 114 mg/dL
Hgb A1c MFr Bld: 5.6 % (ref 4.8–5.6)

## 2016-11-20 LAB — TSH: TSH: 1.25 u[IU]/mL (ref 0.450–4.500)

## 2016-11-20 LAB — VITAMIN D 25 HYDROXY (VIT D DEFICIENCY, FRACTURES): VIT D 25 HYDROXY: 76.4 ng/mL (ref 30.0–100.0)

## 2017-05-26 ENCOUNTER — Ambulatory Visit: Payer: BLUE CROSS/BLUE SHIELD | Admitting: Adult Health

## 2017-05-27 ENCOUNTER — Ambulatory Visit: Payer: BLUE CROSS/BLUE SHIELD | Admitting: Family Medicine

## 2017-06-30 ENCOUNTER — Ambulatory Visit (INDEPENDENT_AMBULATORY_CARE_PROVIDER_SITE_OTHER): Payer: Self-pay | Admitting: Family Medicine

## 2017-06-30 ENCOUNTER — Encounter: Payer: Self-pay | Admitting: Family Medicine

## 2017-06-30 VITALS — BP 107/71 | HR 82 | Ht 64.0 in | Wt 106.9 lb

## 2017-06-30 DIAGNOSIS — Z9111 Patient's noncompliance with dietary regimen: Secondary | ICD-10-CM

## 2017-06-30 DIAGNOSIS — E7800 Pure hypercholesterolemia, unspecified: Secondary | ICD-10-CM

## 2017-06-30 DIAGNOSIS — G43909 Migraine, unspecified, not intractable, without status migrainosus: Secondary | ICD-10-CM | POA: Insufficient documentation

## 2017-06-30 DIAGNOSIS — Z91199 Patient's noncompliance with other medical treatment and regimen due to unspecified reason: Secondary | ICD-10-CM

## 2017-06-30 DIAGNOSIS — R636 Underweight: Secondary | ICD-10-CM | POA: Insufficient documentation

## 2017-06-30 DIAGNOSIS — M810 Age-related osteoporosis without current pathological fracture: Secondary | ICD-10-CM | POA: Insufficient documentation

## 2017-06-30 DIAGNOSIS — E7889 Other lipoprotein metabolism disorders: Secondary | ICD-10-CM

## 2017-06-30 DIAGNOSIS — Z9189 Other specified personal risk factors, not elsewhere classified: Secondary | ICD-10-CM

## 2017-06-30 DIAGNOSIS — E559 Vitamin D deficiency, unspecified: Secondary | ICD-10-CM

## 2017-06-30 NOTE — Patient Instructions (Addendum)
Preventing Osteoporosis, Adult Osteoporosis is a condition that causes the bones to get weaker. With osteoporosis, the bones become thinner, and the normal spaces in bone tissue become larger. This can make the bones weak and cause them to break more easily. People who have osteoporosis are more likely to break their wrist, spine, or hip. Even a minor accident or injury can be enough to break weak bones. Osteoporosis can occur with aging. Your body constantly replaces old bone tissue with new tissue. As you get older, you may lose bone tissue more quickly, or it may be replaced more slowly. Osteoporosis is more likely to develop if you have poor nutrition or do not get enough calcium or vitamin D. Other lifestyle factors can also play a role. By making some diet and lifestyle changes, you can help to keep your bones healthy and help to prevent osteoporosis. What nutrition changes can be made? Nutrition plays an important role in maintaining healthy, strong bones.  Make sure you get enough calcium every day from food or from calcium supplements. ? If you are age 41 or younger, aim to get 1,000 mg of calcium every day. ? If you are older than age 55, aim to get 1,200 mg of calcium every day.  Try to get enough vitamin D every day. ? If you are age 55 or younger, aim to get 600 international units (IU) every day. ? If you are older than age 55, aim to get 800 international units every day.  Follow a healthy diet. Eat plenty of foods that contain calcium and vitamin D. ? Calcium is in milk, cheese, yogurt, and other dairy products. Some fish and vegetables are also good sources of calcium. Many foods such as cereals and breads have had calcium added to them (are fortified). Check nutrition labels to see how much calcium is in a food or drink. ? Foods that contain vitamin D include milk, cereals, salmon, and tuna. Your body also makes vitamin D when you are out in the sun. Bare skin exposure to the  sun on your face, arms, legs, or back for no more than 30 minutes a day, 2 times per week is more than enough. Beyond that, it is important to use sunblock to protect your skin from sunburn, which increases your risk for skin cancer.  What lifestyle changes can be made? Making changes in your everyday life can also play an important role in preventing osteoporosis.  Stay active and get exercise every day. Ask your health care provider what types of exercise are best for you.  Do not use any products that contain nicotine or tobacco, such as cigarettes and e-cigarettes. If you need help quitting, ask your health care provider.  Limit alcohol intake to no more than 1 drink a day for nonpregnant women and 2 drinks a day for men. One drink equals 12 oz of beer, 5 oz of wine, or 1 oz of hard liquor.  Why are these changes important? Making these nutrition and lifestyle changes can:  Help you develop and maintain healthy, strong bones.  Prevent loss of bone mass and the problems that are caused by that loss, such as broken bones and delayed healing.  Make you feel better mentally and physically.  What can happen if changes are not made? Problems that can result from osteoporosis can be very serious. These may include:  A higher risk of broken bones that are painful and do not heal well.  Physical malformations, such  as a collapsed spine or a hunched back.  Problems with movement.  Where to find support: If you need help making changes to prevent osteoporosis, talk with your health care provider. You can ask for a referral to a diet and nutrition specialist (dietitian) and a physical therapist. Where to find more information: Learn more about osteoporosis from:  NIH Osteoporosis and Related Bone Diseases National Resource Center: www.niams.[employer].ch  U.S. Office on Women's Health:  SodaWaters.hu.html  National Osteoporosis Foundation: NetsBook.it  Summary  Osteoporosis is a condition that causes weak bones that are more likely to break.  Eating a healthy diet and making sure you get enough calcium and vitamin D can help prevent osteoporosis.  Other ways to reduce your risk of osteoporosis include getting regular exercise and avoiding alcohol and products that contain nicotine or tobacco. This information is not intended to replace advice given to you by your health care provider. Make sure you discuss any questions you have with your health care provider. Document Released: 01/12/2015 Document Revised: 09/08/2015 Document Reviewed: 09/08/2015 Elsevier Interactive Patient Education  2018 [employer]. Osteoporosis Osteoporosis is the thinning and loss of density in the bones. Osteoporosis makes the bones more brittle, fragile, and likely to break (fracture). Over time, osteoporosis can cause the bones to become so weak that they fracture after a simple fall. The bones most likely to fracture are the bones in the hip, wrist, and spine. What are the causes? The exact cause is not known. What increases the risk? Anyone can develop osteoporosis. You may be at greater risk if you have a family history of the condition or have poor nutrition. You may also have a higher risk if you are:  Female.  55 years old or older.  A smoker.  Not physically active.  White or Asian.  Slender.  What are the signs or symptoms? A fracture might be the first sign of the disease, especially if it results from a fall or injury that would not usually cause a bone to break. Other signs and symptoms include:  Low back and neck pain.  Stooped posture.  Height loss.  How is this diagnosed? To make a diagnosis, your health care provider may:  Take a medical history.  Perform a physical  exam.  Order tests, such as: ? A bone mineral density test. ? A dual-energy X-ray absorptiometry test.  How is this treated? The goal of osteoporosis treatment is to strengthen your bones to reduce your risk of a fracture. Treatment may involve:  Making lifestyle changes, such as: ? Eating a diet rich in calcium. ? Doing weight-bearing and muscle-strengthening exercises. ? Stopping tobacco use. ? Limiting alcohol intake.  Taking medicine to slow the process of bone loss or to increase bone density.  Monitoring your levels of calcium and vitamin D.  Follow these instructions at home:  Include calcium and vitamin D in your diet. Calcium is important for bone health, and vitamin D helps the body absorb calcium.  Perform weight-bearing and muscle-strengthening exercises as directed by your health care provider.  Do not use any tobacco products, including cigarettes, chewing tobacco, and electronic cigarettes. If you need help quitting, ask your health care provider.  Limit your alcohol intake.  Take medicines only as directed by your health care provider.  Keep all follow-up visits as directed by your health care provider. This is important.  Take precautions at home to lower your risk of falling, such as: ? Keeping rooms well lit and clutter  free. ? Installing safety rails on stairs. ? Using rubber mats in the bathroom and other areas that are often wet or slippery. Get help right away if: You fall or injure yourself. This information is not intended to replace advice given to you by your health care provider. Make sure you discuss any questions you have with your health care provider. Document Released: 10/07/2004 Document Revised: 06/02/2015 Document Reviewed: 06/07/2013 Elsevier Interactive Patient Education  [employer]2018 Elsevier Inc.

## 2017-06-30 NOTE — Progress Notes (Signed)
Impression and Recommendations:    1. Elevated LDL cholesterol level   2. Elevated HDL   3. Migraine without status migrainosus, not intractable, unspecified migraine type   4. Vitamin D insufficiency   5. Noncompliance with therapeutic plan   6. Underweight   7. At high risk for osteoporosis     1. Elevated LDL: -Per previous labs on 11/19/2016 her LDL was at 102.   -Discussed and recommended continuation of eating a balanced and modest diet which is low in saturated and trans fats, high in fruits and vegetables and lean proteins  2. Elevated HDL: -Per previous labs on 11/19/2016, her HDL was at 86.   -Patient advised to continue and maintain a healthy diet.   3. Migraine: -Advised and recommended that the patient to control her stress levels as well as consuming adequate amounts of water - half of body weight in oz of water per day  -Continue with Maxalt as needed, patient offered a refill today, to which she didn't need at this time. I advised the patient that she can call the office at anytime if she needs refills.    4. Vitamin D insufficiency: -She takes 5,000 mg vitamin D and 600 mg of calcium BID.   Low BMI and exercise counseling:  -Explained to patient what BMI refers to, and what it means medically to have a lower than normal BMI. Patient's BMI today is 18.4 lower than normal BMI of 18.5.   -Recommended that the patient have a bone density scan completed in the near future due to low BMI.   -Advised the patient to continue with weight lifting/training exercises twice a week (small weights and high reps) to decrease osteoporosis. Also, discussed and advised the patient to join a gym if she needs more guidance for weightlifting.   -American Heart Association exercise guidelines of 30 minutes 5 days per week of moderate intensity or more discussed in detail.  - Health counseling performed and handout provided today.  All questions answered.   Will have the  patient to follow up in November 2019 for a CPE along with labs.   Orders Placed This Encounter  Procedures  . DG Bone Density    No orders of the defined types were placed in this encounter.   Gross side effects, risk and benefits, and alternatives of medications and treatment plan in general discussed with patient.  Patient is aware that all medications have potential side effects and we are unable to predict every side effect or drug-drug interaction that may occur.   Patient will call with any questions prior to using medication if they have concerns.  Expresses verbal understanding and consents to current therapy and treatment regimen.  No barriers to understanding were identified.  Red flag symptoms and signs discussed in detail.  Patient expressed understanding regarding what to do in case of emergency\urgent symptoms  Please see AVS handed out to patient at the end of our visit for further patient instructions/ counseling done pertaining to today's office visit.   Return for [facility] CPE and FBW-.    Note: This note was prepared with assistance of Dragon voice recognition software. Occasional wrong-word or sound-a-like substitutions may have occurred due to the inherent limitations of voice recognition software.   This document serves as a record of services personally performed by DMellody Dance DO. It was created on her behalf by SSteva Colder a trained medical scribe. The creation of this record is based on  the scribe's personal observations and the provider's statements to them.   I have reviewed the above medical documentation for accuracy and completeness and I concur.  Mellody Dance 06/30/17 1:17 PM   --------------------------------------------------------------------------------------------------------------------------------------------------------------------------------------------------------------------------------------------    Subjective:     HPI: Alicia Parrish is a 57 y.o. female who presents to [place] [facility] today for issues as discussed below. She is doing well overall today.    Migraines:  -She notes that she hasn't had a migraines in over a year and she is followed by Dr. Domingo Cocking.   -She takes Maxalt for perimenopausal migraines and Dr. Domingo Cocking noted that it will be fine if her PCP prescribes this for her.   Exercise: -She has been doing 30 minute exercises 3 days a week and working in the yard recently.   -She notes that she does eat a lot, however she doesn't like to be over 105 lbs if possible.   58 y.o. female here for cholesterol follow-up.   - Patient reports good compliance with medications or treatment plan  - Denies medication S-E   - Smoking Status noted   - She denies new onset of: chest pain, exercise intolerance, shortness of breath, dizziness, visual changes, headache, lower extremity swelling or claudication.    The cholesterol last visit was:  Lab Results  Component Value Date   CHOL 201 (H) 11/19/2016   HDL 86 11/19/2016   LDLCALC 102 (H) 11/19/2016   LDLDIRECT 117.6 08/24/2012   TRIG 66 11/19/2016   CHOLHDL 2.3 11/19/2016    Hepatic Function Latest Ref Rng & Units 11/19/2016 08/24/2012  Total Protein 6.0 - 8.5 g/dL 7.1 8.0  Albumin 3.5 - 5.5 g/dL 4.4 4.6  AST 0 - 40 IU/L 16 20  ALT 0 - 32 IU/L 10 18  Alk Phosphatase 39 - 117 IU/L 86 75  Total Bilirubin 0.0 - 1.2 mg/dL 0.3 0.6  Bilirubin, Direct 0.0 - 0.3 mg/dL - 0.1       Wt Readings from Last 3 Encounters:  06/30/17 106 lb 14.4 oz (48.5 kg)  11/19/16 109 lb 8 oz (49.7 kg)  06/17/16 107 lb (48.5 kg)   BP Readings from Last 3 Encounters:  06/30/17 107/71  11/19/16 112/75  06/17/16 118/77   Pulse Readings from Last 3 Encounters:  06/30/17 82  11/19/16 76  06/17/16 71   BMI Readings from Last 3 Encounters:  06/30/17 18.35 kg/m  11/19/16 19.09 kg/m  06/17/16 18.66 kg/m     Patient Care Team     Relationship Specialty Notifications Start End  Mellody Dance, DO PCP - General Family Medicine  06/17/16   Linward Natal, MD Consulting Physician Ophthalmology  03/19/15   Orie Rout, MD Referring Physician Neurology  03/20/15   Dorna Leitz, MD Consulting Physician Orthopedic Surgery  06/17/16   Gatha Mayer, MD Consulting Physician Gastroenterology  06/17/16   Vernie Ammons, MD Referring Physician Dermatology  06/17/16   Louretta Shorten, MD Consulting Physician Obstetrics and Gynecology  06/17/16   Melida Quitter, MD Consulting Physician Otolaryngology  11/19/16      Patient Active Problem List   Diagnosis Date Noted  . Migraine without status migrainosus, not intractable 06/30/2017  . Elevated LDL cholesterol level 06/30/2017  . Underweight 06/30/2017  . At high risk for osteoporosis 06/30/2017  . Noncompliance with therapeutic plan 11/19/2016  . Vitamin D insufficiency 06/17/2016  . Elevated HDL 06/17/2016  . Migraine 03/30/2015  . Preventative health care 03/20/2015  .  History of chicken pox   . Pigmented skin lesions 05/18/2012    Past Medical history, Surgical history, Family history, Social history, Allergies and Medications have been entered into the medical record, reviewed and changed as needed.    Current Meds  Medication Sig  . Biotin 5 MG CAPS Take 5,000 mg by mouth.   . calcium carbonate (OS-CAL) 600 MG TABS Take 600 mg by mouth 2 (two) times daily with a meal.  . Cholecalciferol (VITAMIN D PO) Take by mouth. Vit d3 5000 units-Take one daily  . NON FORMULARY Take 1 tablet by mouth daily. Isotonix OP-3  . NON FORMULARY Take 1 tablet by mouth daily. Calcium Plus  . rizatriptan (MAXALT) 10 MG tablet Take 10 mg by mouth as needed for migraine. May repeat in 2 hours if needed  . zinc gluconate 50 MG tablet Take 50 mg by mouth daily.   Current Facility-Administered Medications for the 06/30/17 encounter (Office Visit) with Mellody Dance, DO  Medication  . 0.9 %   sodium chloride infusion    Allergies:  No Known Allergies   Review of Systems:  A fourteen system review of systems was performed and found to be positive as per HPI.   Objective:   Blood pressure 107/71, pulse 82, height _0  (1.626 m), weight 106 lb 14.4 oz (48.5 kg), SpO2 99 %. Body mass index is 18.35 kg/m. General:  Well Developed, well nourished, appropriate for stated age.  Neuro:  Alert and oriented,  extra-ocular muscles intact  HEENT:  Normocephalic, atraumatic, neck supple, no carotid bruits appreciated  Skin:  no gross rash, warm, pink. Cardiac:  RRR, S1 S2 Respiratory:  ECTA B/L and A/P, Not using accessory muscles, speaking in full sentences- unlabored. Vascular:  Ext warm, no cyanosis apprec.; cap RF less 2 sec. Psych:  No HI/SI, judgement and insight good, Euthymic mood. Full Affect.

## 2017-10-03 ENCOUNTER — Other Ambulatory Visit: Payer: Self-pay | Admitting: Otolaryngology

## 2017-10-03 DIAGNOSIS — R221 Localized swelling, mass and lump, neck: Secondary | ICD-10-CM

## 2017-12-01 ENCOUNTER — Ambulatory Visit: Payer: Self-pay | Admitting: Family Medicine

## 2018-02-02 ENCOUNTER — Encounter: Payer: Self-pay | Admitting: Family Medicine

## 2018-03-02 ENCOUNTER — Encounter: Payer: Self-pay | Admitting: Family Medicine

## 2018-03-02 ENCOUNTER — Ambulatory Visit (INDEPENDENT_AMBULATORY_CARE_PROVIDER_SITE_OTHER): Payer: Self-pay | Admitting: Family Medicine

## 2018-03-02 VITALS — BP 109/77 | HR 72 | Temp 98.2°F | Ht 64.0 in | Wt 109.8 lb

## 2018-03-02 DIAGNOSIS — E7800 Pure hypercholesterolemia, unspecified: Secondary | ICD-10-CM

## 2018-03-02 DIAGNOSIS — E559 Vitamin D deficiency, unspecified: Secondary | ICD-10-CM

## 2018-03-02 DIAGNOSIS — Z0000 Encounter for general adult medical examination without abnormal findings: Secondary | ICD-10-CM

## 2018-03-02 DIAGNOSIS — E7889 Other lipoprotein metabolism disorders: Secondary | ICD-10-CM

## 2018-03-02 NOTE — Patient Instructions (Addendum)
Preventive Care for Adults, Female  A healthy lifestyle and preventive care can promote health and wellness. Preventive health guidelines for women include the following key practices.   A routine yearly physical is a good way to check with your health care provider about your health and preventive screening. It is a chance to share any concerns and updates on your health and to receive a thorough exam.   Visit your dentist for a routine exam and preventive care every 6 months. Brush your teeth twice a day and floss once a day. Good oral hygiene prevents tooth decay and gum disease.   The frequency of eye exams is based on your age, health, family medical history, use of contact lenses, and other factors. Follow your health care provider's recommendations for frequency of eye exams.   Eat a healthy diet. Foods like vegetables, fruits, whole grains, low-fat dairy products, and lean protein foods contain the nutrients you need without too many calories. Decrease your intake of foods high in solid fats, added sugars, and salt. Eat the right amount of calories for you.Get information about a proper diet from your health care provider, if necessary.   Regular physical exercise is one of the most important things you can do for your health. Most adults should get at least 150 minutes of moderate-intensity exercise (any activity that increases your heart rate and causes you to sweat) each week. In addition, most adults need muscle-strengthening exercises on 2 or more days a week.   Maintain a healthy weight. The body mass index (BMI) is a screening tool to identify possible weight problems. It provides an estimate of body fat based on height and weight. Your health care provider can find your BMI, and can help you achieve or maintain a healthy weight.For adults 20 years and older:   - A BMI below 18.5 is considered underweight.   - A BMI of 18.5 to 24.9 is normal.   - A BMI of 25 to 29.9 is  considered overweight.   - A BMI of 30 and above is considered obese.   Maintain normal blood lipids and cholesterol levels by exercising and minimizing your intake of trans and saturated fats.  Eat a balanced diet with plenty of fruit and vegetables. Blood tests for lipids and cholesterol should begin at age 65 and be repeated every 5 years minimum.  If your lipid or cholesterol levels are high, you are over 40, or you are at high risk for heart disease, you may need your cholesterol levels checked more frequently.Ongoing high lipid and cholesterol levels should be treated with medicines if diet and exercise are not working.   If you smoke, find out from your health care provider how to quit. If you do not use tobacco, do not start.   Lung cancer screening is recommended for adults aged 10-80 years who are at high risk for developing lung cancer because of a history of smoking. A yearly low-dose CT scan of the lungs is recommended for people who have at least a 30-pack-year history of smoking and are a current smoker or have quit within the past 15 years. A pack year of smoking is smoking an average of 1 pack of cigarettes a day for 1 year (for example: 1 pack a day for 30 years or 2 packs a day for 15 years). Yearly screening should continue until the smoker has stopped smoking for at least 15 years. Yearly screening should be stopped for people who develop a  health problem that would prevent them from having lung cancer treatment.   If you are pregnant, do not drink alcohol. If you are breastfeeding, be very cautious about drinking alcohol. If you are not pregnant and choose to drink alcohol, do not have more than 1 drink per day. One drink is considered to be 12 ounces (355 mL) of beer, 5 ounces (148 mL) of wine, or 1.5 ounces (44 mL) of liquor.   Avoid use of street drugs. Do not share needles with anyone. Ask for help if you need support or instructions about stopping the use of  drugs.   High blood pressure causes heart disease and increases the risk of stroke. Your blood pressure should be checked at least yearly.  Ongoing high blood pressure should be treated with medicines if weight loss and exercise do not work.   If you are 25-14 years old, ask your health care provider if you should take aspirin to prevent strokes.   Diabetes screening involves taking a blood sample to check your fasting blood sugar level. This should be done once every 3 years, after age 48, if you are within normal weight and without risk factors for diabetes. Testing should be considered at a younger age or be carried out more frequently if you are overweight and have at least 1 risk factor for diabetes.   Breast cancer screening is essential preventive care for women. You should practice "breast self-awareness."  This means understanding the normal appearance and feel of your breasts and may include breast self-examination.  Any changes detected, no matter how small, should be reported to a health care provider.  Women in their 89s and 30s should have a clinical breast exam (CBE) by a health care provider as part of a regular health exam every 1 to 3 years.  After age 63, women should have a CBE every year.  Starting at age 81, women should consider having a mammogram (breast X-ray test) every year.  Women who have a family history of breast cancer should talk to their health care provider about genetic screening.  Women at a high risk of breast cancer should talk to their health care providers about having an MRI and a mammogram every year.   -Breast cancer gene (BRCA)-related cancer risk assessment is recommended for women who have family members with BRCA-related cancers. BRCA-related cancers include breast, ovarian, tubal, and peritoneal cancers. Having family members with these cancers may be associated with an increased risk for harmful changes (mutations) in the breast cancer genes BRCA1 and  BRCA2. Results of the assessment will determine the need for genetic counseling and BRCA1 and BRCA2 testing.   The Pap test is a screening test for cervical cancer. A Pap test can show cell changes on the cervix that might become cervical cancer if left untreated. A Pap test is a procedure in which cells are obtained and examined from the lower end of the uterus (cervix).   - Women should have a Pap test starting at age 13.   - Between ages 51 and 22, Pap tests should be repeated every 2 years.   - Beginning at age 70, you should have a Pap test every 3 years as long as the past 3 Pap tests have been normal.   - Some women have medical problems that increase the chance of getting cervical cancer. Talk to your health care provider about these problems. It is especially important to talk to your health care provider if a  new problem develops soon after your last Pap test. In these cases, your health care provider may recommend more frequent screening and Pap tests.   - The above recommendations are the same for women who have or have not gotten the vaccine for human papillomavirus (HPV).   - If you had a hysterectomy for a problem that was not cancer or a condition that could lead to cancer, then you no longer need Pap tests. Even if you no longer need a Pap test, a regular exam is a good idea to make sure no other problems are starting.   - If you are between ages 8 and 94 years, and you have had normal Pap tests going back 10 years, you no longer need Pap tests. Even if you no longer need a Pap test, a regular exam is a good idea to make sure no other problems are starting.   - If you have had past treatment for cervical cancer or a condition that could lead to cancer, you need Pap tests and screening for cancer for at least 20 years after your treatment.   - If Pap tests have been discontinued, risk factors (such as a new sexual partner) need to be reassessed to determine if screening should  be resumed.   - The HPV test is an additional test that may be used for cervical cancer screening. The HPV test looks for the virus that can cause the cell changes on the cervix. The cells collected during the Pap test can be tested for HPV. The HPV test could be used to screen women aged 9 years and older, and should be used in women of any age who have unclear Pap test results. After the age of 48, women should have HPV testing at the same frequency as a Pap test.   Colorectal cancer can be detected and often prevented. Most routine colorectal cancer screening begins at the age of 94 years and continues through age 56 years. However, your health care provider may recommend screening at an earlier age if you have risk factors for colon cancer. On a yearly basis, your health care provider may provide home test kits to check for hidden blood in the stool.  Use of a small camera at the end of a tube, to directly examine the colon (sigmoidoscopy or colonoscopy), can detect the earliest forms of colorectal cancer. Talk to your health care provider about this at age 47, when routine screening begins. Direct exam of the colon should be repeated every 5 -10 years through age 65 years, unless early forms of pre-cancerous polyps or small growths are found.   People who are at an increased risk for hepatitis B should be screened for this virus. You are considered at high risk for hepatitis B if:  -You were born in a country where hepatitis B occurs often. Talk with your health care provider about which countries are considered high risk.  - Your parents were born in a high-risk country and you have not received a shot to protect against hepatitis B (hepatitis B vaccine).  - You have HIV or AIDS.  - You use needles to inject street drugs.  - You live with, or have sex with, someone who has Hepatitis B.  - You get hemodialysis treatment.  - You take certain medicines for conditions like cancer, organ  transplantation, and autoimmune conditions.   Hepatitis C blood testing is recommended for all people born from [birth year] through 1965 and any individual  with known risks for hepatitis C.   Practice safe sex. Use condoms and avoid high-risk sexual practices to reduce the spread of sexually transmitted infections (STIs). STIs include gonorrhea, chlamydia, syphilis, trichomonas, herpes, HPV, and human immunodeficiency virus (HIV). Herpes, HIV, and HPV are viral illnesses that have no cure. They can result in disability, cancer, and death. Sexually active women aged 17 years and younger should be checked for chlamydia. Older women with new or multiple partners should also be tested for chlamydia. Testing for other STIs is recommended if you are sexually active and at increased risk.   Osteoporosis is a disease in which the bones lose minerals and strength with aging. This can result in serious bone fractures or breaks. The risk of osteoporosis can be identified using a bone density scan. Women ages 38 years and over and women at risk for fractures or osteoporosis should discuss screening with their health care providers. Ask your health care provider whether you should take a calcium supplement or vitamin D to There are also several preventive steps women can take to avoid osteoporosis and resulting fractures or to keep osteoporosis from worsening. -->Recommendations include:  Eat a balanced diet high in fruits, vegetables, calcium, and vitamins.  Get enough calcium. The recommended total intake of is 1,200 mg daily; for best absorption, if taking supplements, divide doses into 250-500 mg doses throughout the day. Of the two types of calcium, calcium carbonate is best absorbed when taken with food but calcium citrate can be taken on an empty stomach.  Get enough vitamin D. [employer] and the [place] recommend at least 1,000 IU per day for women age 28 and over who are at risk of vitamin D  deficiency. Vitamin D deficiency can be caused by inadequate sun exposure (for example, those who live in [place]).  Avoid alcohol and smoking. Heavy alcohol intake (more than 7 drinks per week) increases the risk of falls and hip fracture and women smokers tend to lose bone more rapidly and have lower bone mass than nonsmokers. Stopping smoking is one of the most important changes women can make to improve their health and decrease risk for disease.  Be physically active every day. Weight-bearing exercise (for example, fast walking, hiking, jogging, and weight training) may strengthen bones or slow the rate of bone loss that comes with aging. Balancing and muscle-strengthening exercises can reduce the risk of falling and fracture.  Consider therapeutic medications. Currently, several types of effective drugs are available. Healthcare providers can recommend the type most appropriate for each woman.  Eliminate environmental factors that may contribute to accidents. Falls cause nearly 90% of all osteoporotic fractures, so reducing this risk is an important bone-health strategy. Measures include ample lighting, removing obstructions to walking, using nonskid rugs on floors, and placing mats and/or grab bars in showers.  Be aware of medication side effects. Some common medicines make bones weaker. These include a type of steroid drug called glucocorticoids used for arthritis and asthma, some antiseizure drugs, certain sleeping pills, treatments for endometriosis, and some cancer drugs. An overactive thyroid gland or using too much thyroid hormone for an underactive thyroid can also be a problem. If you are taking these medicines, talk to your doctor about what you can do to help protect your bones.reduce the rate of osteoporosis.    Menopause can be associated with physical symptoms and risks. Hormone replacement therapy is available to decrease symptoms and risks. You should talk to your  health care provider  about whether hormone replacement therapy is right for you.   Use sunscreen. Apply sunscreen liberally and repeatedly throughout the day. You should seek shade when your shadow is shorter than you. Protect yourself by wearing long sleeves, pants, a wide-brimmed hat, and sunglasses year round, whenever you are outdoors.   Once a month, do a whole body skin exam, using a mirror to look at the skin on your back. Tell your health care provider of new moles, moles that have irregular borders, moles that are larger than a pencil eraser, or moles that have changed in shape or color.   -Stay current with required vaccines (immunizations).   Influenza vaccine. All adults should be immunized every year.  Tetanus, diphtheria, and acellular pertussis (Td, Tdap) vaccine. Pregnant women should receive 1 dose of Tdap vaccine during each pregnancy. The dose should be obtained regardless of the length of time since the last dose. Immunization is preferred during the 27th 36th week of gestation. An adult who has not previously received Tdap or who does not know her vaccine status should receive 1 dose of Tdap. This initial dose should be followed by tetanus and diphtheria toxoids (Td) booster doses every 10 years. Adults with an unknown or incomplete history of completing a 3-dose immunization series with Td-containing vaccines should begin or complete a primary immunization series including a Tdap dose. Adults should receive a Td booster every 10 years.  Varicella vaccine. An adult without evidence of immunity to varicella should receive 2 doses or a second dose if she has previously received 1 dose. Pregnant females who do not have evidence of immunity should receive the first dose after pregnancy. This first dose should be obtained before leaving the health care facility. The second dose should be obtained 4 8 weeks after the first dose.  Human papillomavirus (HPV) vaccine. Females aged 13 26  years who have not received the vaccine previously should obtain the 3-dose series. The vaccine is not recommended for use in pregnant females. However, pregnancy testing is not needed before receiving a dose. If a female is found to be pregnant after receiving a dose, no treatment is needed. In that case, the remaining doses should be delayed until after the pregnancy. Immunization is recommended for any person with an immunocompromised condition through the age of 26 years if she did not get any or all doses earlier. During the 3-dose series, the second dose should be obtained 4 8 weeks after the first dose. The third dose should be obtained 24 weeks after the first dose and 16 weeks after the second dose.  Zoster vaccine. One dose is recommended for adults aged 60 years or older unless certain conditions are present.  Measles, mumps, and rubella (MMR) vaccine. Adults born before 1957 generally are considered immune to measles and mumps. Adults born in 1957 or later should have 1 or more doses of MMR vaccine unless there is a contraindication to the vaccine or there is laboratory evidence of immunity to each of the three diseases. A routine second dose of MMR vaccine should be obtained at least 28 days after the first dose for students attending postsecondary schools, health care workers, or international travelers. People who received inactivated measles vaccine or an unknown type of measles vaccine during 1963 1967 should receive 2 doses of MMR vaccine. People who received inactivated mumps vaccine or an unknown type of mumps vaccine before 1979 and are at high risk for mumps infection should consider immunization with 2 doses of   MMR vaccine. For females of childbearing age, rubella immunity should be determined. If there is no evidence of immunity, females who are not pregnant should be vaccinated. If there is no evidence of immunity, females who are pregnant should delay immunization until after pregnancy.  Unvaccinated health care workers born before 1957 who lack laboratory evidence of measles, mumps, or rubella immunity or laboratory confirmation of disease should consider measles and mumps immunization with 2 doses of MMR vaccine or rubella immunization with 1 dose of MMR vaccine.  Pneumococcal 13-valent conjugate (PCV13) vaccine. When indicated, a person who is uncertain of her immunization history and has no record of immunization should receive the PCV13 vaccine. An adult aged 19 years or older who has certain medical conditions and has not been previously immunized should receive 1 dose of PCV13 vaccine. This PCV13 should be followed with a dose of pneumococcal polysaccharide (PPSV23) vaccine. The PPSV23 vaccine dose should be obtained at least 8 weeks after the dose of PCV13 vaccine. An adult aged 19 years or older who has certain medical conditions and previously received 1 or more doses of PPSV23 vaccine should receive 1 dose of PCV13. The PCV13 vaccine dose should be obtained 1 or more years after the last PPSV23 vaccine dose.  Pneumococcal polysaccharide (PPSV23) vaccine. When PCV13 is also indicated, PCV13 should be obtained first. All adults aged 65 years and older should be immunized. An adult younger than age 65 years who has certain medical conditions should be immunized. Any person who resides in a nursing home or long-term care facility should be immunized. An adult smoker should be immunized. People with an immunocompromised condition and certain other conditions should receive both PCV13 and PPSV23 vaccines. People with human immunodeficiency virus (HIV) infection should be immunized as soon as possible after diagnosis. Immunization during chemotherapy or radiation therapy should be avoided. Routine use of PPSV23 vaccine is not recommended for American Indians, [place] Natives, or people younger than 65 years unless there are medical conditions that require PPSV23 vaccine. When indicated,  people who have unknown immunization and have no record of immunization should receive PPSV23 vaccine. One-time revaccination 5 years after the first dose of PPSV23 is recommended for people aged 19 64 years who have chronic kidney failure, nephrotic syndrome, asplenia, or immunocompromised conditions. People who received 1 2 doses of PPSV23 before age 65 years should receive another dose of PPSV23 vaccine at age 65 years or later if at least 5 years have passed since the previous dose. Doses of PPSV23 are not needed for people immunized with PPSV23 at or after age 65 years.  Meningococcal vaccine. Adults with asplenia or persistent complement component deficiencies should receive 2 doses of quadrivalent meningococcal conjugate (MenACWY-D) vaccine. The doses should be obtained at least 2 months apart. Microbiologists working with certain meningococcal bacteria, military recruits, people at risk during an outbreak, and people who travel to or live in countries with a high rate of meningitis should be immunized. A first-year college student up through age 21 years who is living in a residence hall should receive a dose if she did not receive a dose on or after her 16th birthday. Adults who have certain high-risk conditions should receive one or more doses of vaccine.  Hepatitis A vaccine. Adults who wish to be protected from this disease, have certain high-risk conditions, work with hepatitis A-infected animals, work in hepatitis A research labs, or travel to or work in countries with a high rate of hepatitis A should be   immunized. Adults who were previously unvaccinated and who anticipate close contact with an international adoptee during the first 60 days after arrival in the [place] from a country with a high rate of hepatitis A should be immunized.  Hepatitis B vaccine.  Adults who wish to be protected from this disease, have certain high-risk conditions, may be exposed to blood or other infectious  body fluids, are household contacts or sex partners of hepatitis B positive people, are clients or workers in certain care facilities, or travel to or work in countries with a high rate of hepatitis B should be immunized.  Haemophilus influenzae type b (Hib) vaccine. A previously unvaccinated person with asplenia or sickle cell disease or having a scheduled splenectomy should receive 1 dose of Hib vaccine. Regardless of previous immunization, a recipient of a hematopoietic stem cell transplant should receive a 3-dose series 6 12 months after her successful transplant. Hib vaccine is not recommended for adults with HIV infection.  Preventive Services / Frequency Ages 19 to 39years  Blood pressure check.** / Every 1 to 2 years.  Lipid and cholesterol check.** / Every 5 years beginning at age 20.  Clinical breast exam.** / Every 3 years for women in their 20s and 30s.  BRCA-related cancer risk assessment.** / For women who have family members with a BRCA-related cancer (breast, ovarian, tubal, or peritoneal cancers).  Pap test.** / Every 2 years from ages 21 through 29. Every 3 years starting at age 30 through age 65 or 70 with a history of 3 consecutive normal Pap tests.  HPV screening.** / Every 3 years from ages 30 through ages 65 to 70 with a history of 3 consecutive normal Pap tests.  Hepatitis C blood test.** / For any individual with known risks for hepatitis C.  Skin self-exam. / Monthly.  Influenza vaccine. / Every year.  Tetanus, diphtheria, and acellular pertussis (Tdap, Td) vaccine.** / Consult your health care provider. Pregnant women should receive 1 dose of Tdap vaccine during each pregnancy. 1 dose of Td every 10 years.  Varicella vaccine.** / Consult your health care provider. Pregnant females who do not have evidence of immunity should receive the first dose after pregnancy.  HPV vaccine. / 3 doses over 6 months, if 26 and younger. The vaccine is not recommended for use in  pregnant females. However, pregnancy testing is not needed before receiving a dose.  Measles, mumps, rubella (MMR) vaccine.** / You need at least 1 dose of MMR if you were born in 1957 or later. You may also need a 2nd dose. For females of childbearing age, rubella immunity should be determined. If there is no evidence of immunity, females who are not pregnant should be vaccinated. If there is no evidence of immunity, females who are pregnant should delay immunization until after pregnancy.  Pneumococcal 13-valent conjugate (PCV13) vaccine.** / Consult your health care provider.  Pneumococcal polysaccharide (PPSV23) vaccine.** / 1 to 2 doses if you smoke cigarettes or if you have certain conditions.  Meningococcal vaccine.** / 1 dose if you are age 19 to 21 years and a first-year college student living in a residence hall, or have one of several medical conditions, you need to get vaccinated against meningococcal disease. You may also need additional booster doses.  Hepatitis A vaccine.** / Consult your health care provider.  Hepatitis B vaccine.** / Consult your health care provider.  Haemophilus influenzae type b (Hib) vaccine.** / Consult your health care provider.  Ages 40 to 64years    Blood pressure check.** / Every 1 to 2 years.  Lipid and cholesterol check.** / Every 5 years beginning at age 22 years.  Lung cancer screening. / Every year if you are aged 81 80 years and have a 30-pack-year history of smoking and currently smoke or have quit within the past 15 years. Yearly screening is stopped once you have quit smoking for at least 15 years or develop a health problem that would prevent you from having lung cancer treatment.  Clinical breast exam.** / Every year after age 100 years.  BRCA-related cancer risk assessment.** / For women who have family members with a BRCA-related cancer (breast, ovarian, tubal, or peritoneal cancers).  Mammogram.** / Every year beginning at age 74  years and continuing for as long as you are in good health. Consult with your health care provider.  Pap test.** / Every 3 years starting at age 28 years through age 30 or 38 years with a history of 3 consecutive normal Pap tests.  HPV screening.** / Every 3 years from ages 85 years through ages 91 to 58 years with a history of 3 consecutive normal Pap tests.  Fecal occult blood test (FOBT) of stool. / Every year beginning at age 60 years and continuing until age 55 years. You may not need to do this test if you get a colonoscopy every 10 years.  Flexible sigmoidoscopy or colonoscopy.** / Every 5 years for a flexible sigmoidoscopy or every 10 years for a colonoscopy beginning at age 3 years and continuing until age 56 years.  Hepatitis C blood test.** / For all people born from [birth year] through 1963 and any individual with known risks for hepatitis C.  Skin self-exam. / Monthly.  Influenza vaccine. / Every year.  Tetanus, diphtheria, and acellular pertussis (Tdap/Td) vaccine.** / Consult your health care provider. Pregnant women should receive 1 dose of Tdap vaccine during each pregnancy. 1 dose of Td every 10 years.  Varicella vaccine.** / Consult your health care provider. Pregnant females who do not have evidence of immunity should receive the first dose after pregnancy.  Zoster vaccine.** / 1 dose for adults aged 50 years or older.  Measles, mumps, rubella (MMR) vaccine.** / You need at least 1 dose of MMR if you were born in 1957 or later. You may also need a 2nd dose. For females of childbearing age, rubella immunity should be determined. If there is no evidence of immunity, females who are not pregnant should be vaccinated. If there is no evidence of immunity, females who are pregnant should delay immunization until after pregnancy.  Pneumococcal 13-valent conjugate (PCV13) vaccine.** / Consult your health care provider.  Pneumococcal polysaccharide (PPSV23) vaccine.** / 1 to 2 doses if  you smoke cigarettes or if you have certain conditions.  Meningococcal vaccine.** / Consult your health care provider.  Hepatitis A vaccine.** / Consult your health care provider.  Hepatitis B vaccine.** / Consult your health care provider.  Haemophilus influenzae type b (Hib) vaccine.** / Consult your health care provider.  Ages 47 years and over  Blood pressure check.** / Every 1 to 2 years.  Lipid and cholesterol check.** / Every 5 years beginning at age 55 years.  Lung cancer screening. / Every year if you are aged 57 80 years and have a 30-pack-year history of smoking and currently smoke or have quit within the past 15 years. Yearly screening is stopped once you have quit smoking for at least 15 years or develop a health problem that  would prevent you from having lung cancer treatment.  Clinical breast exam.** / Every year after age 68 years.  BRCA-related cancer risk assessment.** / For women who have family members with a BRCA-related cancer (breast, ovarian, tubal, or peritoneal cancers).  Mammogram.** / Every year beginning at age 47 years and continuing for as long as you are in good health. Consult with your health care provider.  Pap test.** / Every 3 years starting at age 59 years through age 26 or 26 years with 3 consecutive normal Pap tests. Testing can be stopped between 65 and 70 years with 3 consecutive normal Pap tests and no abnormal Pap or HPV tests in the past 10 years.  HPV screening.** / Every 3 years from ages 71 years through ages 19 or 96 years with a history of 3 consecutive normal Pap tests. Testing can be stopped between 65 and 70 years with 3 consecutive normal Pap tests and no abnormal Pap or HPV tests in the past 10 years.  Fecal occult blood test (FOBT) of stool. / Every year beginning at age 59 years and continuing until age 7 years. You may not need to do this test if you get a colonoscopy every 10 years.  Flexible sigmoidoscopy or colonoscopy.** /  Every 5 years for a flexible sigmoidoscopy or every 10 years for a colonoscopy beginning at age 28 years and continuing until age 27 years.  Hepatitis C blood test.** / For all people born from [birth year] through 1963 and any individual with known risks for hepatitis C.  Osteoporosis screening.** / A one-time screening for women ages 6 years and over and women at risk for fractures or osteoporosis.  Skin self-exam. / Monthly.  Influenza vaccine. / Every year.  Tetanus, diphtheria, and acellular pertussis (Tdap/Td) vaccine.** / 1 dose of Td every 10 years.  Varicella vaccine.** / Consult your health care provider.  Zoster vaccine.** / 1 dose for adults aged 20 years or older.  Pneumococcal 13-valent conjugate (PCV13) vaccine.** / Consult your health care provider.  Pneumococcal polysaccharide (PPSV23) vaccine.** / 1 dose for all adults aged 95 years and older.  Meningococcal vaccine.** / Consult your health care provider.  Hepatitis A vaccine.** / Consult your health care provider.  Hepatitis B vaccine.** / Consult your health care provider.  Haemophilus influenzae type b (Hib) vaccine.** / Consult your health care provider. ** Family history and personal history of risk and conditions may change your health care provider's recommendations. Document Released: 02/23/2001 Document Revised: 10/18/2012  [facility] Patient Information 2014 [place], [place].   EXERCISE AND DIET:  We recommended that you start or continue a regular exercise program for good health. Regular exercise means any activity that makes your heart beat faster and makes you sweat.  We recommend exercising at least 30 minutes per day at least 3 days a week, preferably 5.  We also recommend a diet low in fat and sugar / carbohydrates.  Inactivity, poor dietary choices and obesity can cause diabetes, heart attack, stroke, and kidney damage, among others.     ALCOHOL AND SMOKING:  Women should limit their alcohol intake to no  more than 7 drinks/beers/glasses of wine (combined, not each!) per week. Moderation of alcohol intake to this level decreases your risk of breast cancer and liver damage.  ( And of course, no recreational drugs are part of a healthy lifestyle.)  Also, you should not be smoking at all or even being exposed to second hand smoke. Most people know smoking can  cause cancer, and various heart and lung diseases, but did you know it also contributes to weakening of your bones?  Aging of your skin?  Yellowing of your teeth and nails?   CALCIUM AND VITAMIN D:  Adequate intake of calcium and Vitamin D are recommended.  The recommendations for exact amounts of these supplements seem to change often, but generally speaking 600 mg of calcium (either carbonate or citrate) and 800 units of Vitamin D per day seems prudent. Certain women may benefit from higher intake of Vitamin D.  If you are among these women, your doctor will have told you during your visit.     PAP SMEARS:  Pap smears, to check for cervical cancer or precancers,  have traditionally been done yearly, although recent scientific advances have shown that most women can have pap smears less often.  However, every woman still should have a physical exam from her gynecologist or primary care physician every year. It will include a breast check, inspection of the vulva and vagina to check for abnormal growths or skin changes, a visual exam of the cervix, and then an exam to evaluate the size and shape of the uterus and ovaries.  And after 56 years of age, a rectal exam is indicated to check for rectal cancers. We will also provide age appropriate advice regarding health maintenance, like when you should have certain vaccines, screening for sexually transmitted diseases, bone density testing, colonoscopy, mammograms, etc.    MAMMOGRAMS:  All women over 40 years old should have a yearly mammogram. Many facilities now offer a "3D" mammogram, which may cost  around $50 extra out of pocket. If possible,  we recommend you accept the option to have the 3D mammogram performed.  It both reduces the number of women who will be called back for extra views which then turn out to be normal, and it is better than the routine mammogram at detecting truly abnormal areas.     COLONOSCOPY:  Colonoscopy to screen for colon cancer is recommended for all women at age 50.  We know, you hate the idea of the prep.  We agree, BUT, having colon cancer and not knowing it is worse!!  Colon cancer so often starts as a polyp that can be seen and removed at colonscopy, which can quite literally save your life!  And if your first colonoscopy is normal and you have no family history of colon cancer, most women don't have to have it again for 10 years.  Once every ten years, you can do something that may end up saving your life, right?  We will be happy to help you get it scheduled when you are ready.  Be sure to check your insurance coverage so you understand how much it will cost.  It may be covered as a preventative service at no cost, but you should check your particular policy.     Please realize, EXERCISE IS MEDICINE!  -  American Heart Association ( AHA) guidelines for exercise : If you are in good health, without any medical conditions, you should engage in 150-300 minutes of moderate intensity aerobic activity per week.  This means you should be huffing and puffing throughout your workout.   Engaging in regular exercise will improve brain function and memory, as well as improve mood, boost immune system and help with weight management.  As well as the other, more well-known effects of exercise such as decreasing blood sugar levels, decreasing blood pressure,  and decreasing   bad cholesterol levels/ increasing good cholesterol levels.     -  The AHA strongly endorses consumption of a diet that contains a variety of foods from all the food categories with an emphasis on fruits and  vegetables; fat-free and low-fat dairy products; cereal and grain products; legumes and nuts; and fish, poultry, and/or extra lean meats.    Excessive food intake, especially of foods high in saturated and trans fats, sugar, and salt, should be avoided.    Adequate water intake of roughly 1/2 of your weight in pounds, should equal the ounces of water per day you should drink.  So for instance, if you're 200 pounds, that would be 100 ounces of water per day.         Mediterranean Diet  Why follow it? Research shows. . Those who follow the Mediterranean diet have a reduced risk of heart disease  . The diet is associated with a reduced incidence of Parkinson's and Alzheimer's diseases . People following the diet may have longer life expectancies and lower rates of chronic diseases  . The Dietary Guidelines for Americans recommends the Mediterranean diet as an eating plan to promote health and prevent disease  What Is the Mediterranean Diet?  . Healthy eating plan based on typical foods and recipes of Mediterranean-style cooking . The diet is primarily a plant based diet; these foods should make up a majority of meals   Starches - Plant based foods should make up a majority of meals - They are an important sources of vitamins, minerals, energy, antioxidants, and fiber - Choose whole grains, foods high in fiber and minimally processed items  - Typical grain sources include wheat, oats, barley, corn, brown rice, bulgar, farro, millet, polenta, couscous  - Various types of beans include chickpeas, lentils, fava beans, black beans, white beans   Fruits  Veggies - Large quantities of antioxidant rich fruits & veggies; 6 or more servings  - Vegetables can be eaten raw or lightly drizzled with oil and cooked  - Vegetables common to the traditional Mediterranean Diet include: artichokes, arugula, beets, broccoli, brussel sprouts, cabbage, carrots, celery, collard greens, cucumbers, eggplant, kale, leeks,  lemons, lettuce, mushrooms, okra, onions, peas, peppers, potatoes, pumpkin, radishes, rutabaga, shallots, spinach, sweet potatoes, turnips, zucchini - Fruits common to the Mediterranean Diet include: apples, apricots, avocados, cherries, clementines, dates, figs, grapefruits, grapes, melons, nectarines, oranges, peaches, pears, pomegranates, strawberries, tangerines  Fats - Replace butter and margarine with healthy oils, such as olive oil, canola oil, and tahini  - Limit nuts to no more than a handful a day  - Nuts include walnuts, almonds, pecans, pistachios, pine nuts  - Limit or avoid candied, honey roasted or heavily salted nuts - Olives are central to the Mediterranean diet - can be eaten whole or used in a variety of dishes   Meats Protein - Limiting red meat: no more than a few times a month - When eating red meat: choose lean cuts and keep the portion to the size of deck of cards - Eggs: approx. 0 to 4 times a week  - Fish and lean poultry: at least 2 a week  - Healthy protein sources include, chicken, turkey, lean beef, lamb - Increase intake of seafood such as tuna, salmon, trout, mackerel, shrimp, scallops - Avoid or limit high fat processed meats such as sausage and bacon  Dairy - Include moderate amounts of low fat dairy products  - Focus on healthy dairy such as fat free yogurt, skim   milk, low or reduced fat cheese - Limit dairy products higher in fat such as whole or 2% milk, cheese, ice cream  Alcohol - Moderate amounts of red wine is ok  - No more than 5 oz daily for women (all ages) and men older than age 65  - No more than 10 oz of wine daily for men younger than 65  Other - Limit sweets and other desserts  - Use herbs and spices instead of salt to flavor foods  - Herbs and spices common to the traditional Mediterranean Diet include: basil, bay leaves, chives, cloves, cumin, fennel, garlic, lavender, marjoram, mint, oregano, parsley, pepper, rosemary, sage, savory, sumac,  tarragon, thyme   It's not just a diet, it's a lifestyle:  . The Mediterranean diet includes lifestyle factors typical of those in the region  . Foods, drinks and meals are best eaten with others and savored . Daily physical activity is important for overall good health . This could be strenuous exercise like running and aerobics . This could also be more leisurely activities such as walking, housework, yard-work, or taking the stairs . Moderation is the key; a balanced and healthy diet accommodates most foods and drinks . Consider portion sizes and frequency of consumption of certain foods   Meal Ideas & Options:  . Breakfast:  o Whole wheat toast or whole wheat English muffins with peanut butter & hard boiled egg o Steel cut oats topped with apples & cinnamon and skim milk  o Fresh fruit: banana, strawberries, melon, berries, peaches  o Smoothies: strawberries, bananas, greek yogurt, peanut butter o Low fat greek yogurt with blueberries and granola  o Egg white omelet with spinach and mushrooms o Breakfast couscous: whole wheat couscous, apricots, skim milk, cranberries  . Sandwiches:  o Hummus and grilled vegetables (peppers, zucchini, squash) on whole wheat bread   o Grilled chicken on whole wheat pita with lettuce, tomatoes, cucumbers or tzatziki  o Tuna salad on whole wheat bread: tuna salad made with greek yogurt, olives, red peppers, capers, green onions o Garlic rosemary lamb pita: lamb sauted with garlic, rosemary, salt & pepper; add lettuce, cucumber, greek yogurt to pita - flavor with lemon juice and black pepper  . Seafood:  o Mediterranean grilled salmon, seasoned with garlic, basil, parsley, lemon juice and black pepper o Shrimp, lemon, and spinach whole-grain pasta salad made with low fat greek yogurt  o Seared scallops with lemon orzo  o Seared tuna steaks seasoned salt, pepper, coriander topped with tomato mixture of olives, tomatoes, olive oil, minced garlic, parsley,  green onions and cappers  . Meats:  o Herbed greek chicken salad with kalamata olives, cucumber, feta  o Red bell peppers stuffed with spinach, bulgur, lean ground beef (or lentils) & topped with feta   o Kebabs: skewers of chicken, tomatoes, onions, zucchini, squash  o Turkey burgers: made with red onions, mint, dill, lemon juice, feta cheese topped with roasted red peppers . Vegetarian o Cucumber salad: cucumbers, artichoke hearts, celery, red onion, feta cheese, tossed in olive oil & lemon juice  o Hummus and whole grain pita points with a greek salad (lettuce, tomato, feta, olives, cucumbers, red onion) o Lentil soup with celery, carrots made with vegetable broth, garlic, salt and pepper  o Tabouli salad: parsley, bulgur, mint, scallions, cucumbers, tomato, radishes, lemon juice, olive oil, salt and pepper.   

## 2018-03-02 NOTE — Progress Notes (Addendum)
Impression and Recommendations:    1. Vitamin D insufficiency   2. Elevated HDL   3. Preventative health care   4. Elevated LDL cholesterol level   5. Encounter for wellness examination     1) Anticipatory Guidance: Discussed importance of wearing a seatbelt while driving, not texting while driving; sunscreen when outside along with yearly skin surveillance; eating a well balanced and modest diet; physical activity at least 25 minutes per day or 150 min/ week of moderate to intense activity.  2) Immunizations / Screenings / Labs:  All immunizations and screenings that patient agrees to, are up-to-date per recommendations or will be updated today.  Patient understands the needs for q 67mo dental and yearly vision screens which pt will schedule independently. Obtain CBC, CMP, HgA1c, Lipid panel, TSH and vit D when fasting if not already done recently.   Discussed with the patient the importance of getting her shringrex vaccine and that due to her getting shingles at age 6, she is at a high risk of post-hepatic neuralgic syndrome.   Will send the patient home with an iFOB cards to check microscopic blood in stool. This is to be used in the interim until her next colonoscopy in 2028.   Discussed with the patient regarding asking her GYN specialist, on coming to the clinic every 5 years for pap smears, however, discussed that she should follow the opinion of her GYN specialist.   3) Weight:   Discussed goal of losing even 5-10% of current body weight which would improve overall feelings of well being and improve objective health data significantly.   Improve nutrient density of diet through increasing intake of fruits and vegetables and decreasing saturated/trans fats, white flour products and refined sugar products.   Recommended that the patient not eat as much red meat or smoked meats as it has a high risk of being linked to colon cancer.   4) Exercise Management:   American Heart  Association guidelines for healthy diet, basically Mediterranean diet, and exercise guidelines of 30 minutes 5 days per week or more discussed in detail.   Health counseling performed.  All questions answered.   Orders Placed This Encounter  Procedures  . CBC with Differential/Platelet  . Comprehensive metabolic panel  . Hemoglobin A1c  . Lipid panel  . T4, free  . TSH  . VITAMIN D 25 Hydroxy (Vit-D Deficiency, Fractures)    Gross side effects, risk and benefits, and alternatives of medications discussed with patient.  Patient is aware that all medications have potential side effects and we are unable to predict every side effect or drug-drug interaction that may occur.  Expresses verbal understanding and consents to current therapy plan and treatment regimen.  F-up preventative CPE in 1 year. F/up sooner for chronic care management as discussed and/or prn.  Please see orders placed and AVS handed out to patient at the end of our visit for further patient instructions/ counseling done pertaining to today's office visit.  This document serves as a record of services personally performed by Alicia Lot, DO. It was created on her behalf by Alicia Parrish, a trained medical scribe. The creation of this record is based on the scribe's personal observations and the provider's statements to them.   I have reviewed the above medical documentation for accuracy and completeness and I concur.  Alicia Lot, DO 03/02/2018 3:36 PM     Subjective:    Chief Complaint  Patient presents with  . Annual Exam  HPI: Alicia Parrish is a 58 y.o. female who presents to [facility] today a yearly health maintenance exam.  Health Maintenance Summary Reviewed and updated, unless pt declines services. She has her GYN specialist, Dr. Rana Parrish at [facility] and she has an appointment upcoming. She notes that she goes ever year to her GYN specialist, however,  she hasn't had any abnormal pap smears. She has had shingles in the past at age 71, however, she hasn't had the shringrex vaccine. She will go to her eye doctor, Dr. Alben Parrish in March 2020 and she goes yearly. She recently switched dermatologists to Dr. Margo Parrish and she goes yearly for her skin exams. She eats a balanced diet with red meat at least 3 times a week.    Colonoscopy:    Last one 2018 with Dr. Leone Parrish and she will have a repeat in 10 years.  Tobacco History Reviewed:   Y  CT scan for screening lung CA:   She doesn't meet the criteria for this.  Abdominal Ultrasound:     ( Unnecessary secondary to < 61 or > 64 years old) Alcohol:    No concerns, no excessive use Exercise Habits:   Not meeting AHA guidelines STD concerns:   None, one partner, no blood transfusions or tattoos.  Drug Use:   None Birth control method:   n/a Menses regular:     n/a Lumps or breast concerns:      no Breast Cancer Family History:      No Bone/ DEXA scan:    Had one thru her GYN office; she also gets care through them for Pap smears, mammograms etc.   Immunization History  Administered Date(s) Administered  . Tdap 03/10/2016    Health Maintenance  Topic Date Due  . Hepatitis C Screening  07/01/2018 (Originally May 30, 1960)  . HIV Screening  07/01/2018 (Originally 05/11/1977)  . INFLUENZA VACCINE  10/12/2018 (Originally 08/11/2017)  . MAMMOGRAM  03/29/2019  . PAP SMEAR-Modifier  12/24/2019  . TETANUS/TDAP  03/10/2026  . COLONOSCOPY  05/29/2026     Wt Readings from Last 3 Encounters:  03/02/18 109 lb 12.8 oz (49.8 kg)  06/30/17 106 lb 14.4 oz (48.5 kg)  11/19/16 109 lb 8 oz (49.7 kg)   BP Readings from Last 3 Encounters:  03/02/18 109/77  06/30/17 107/71  11/19/16 112/75   Pulse Readings from Last 3 Encounters:  03/02/18 72  06/30/17 82  11/19/16 76     Past Medical History:  Diagnosis Date  . Headache(784.0)   . History of chicken pox   . Hyperlipidemia, mixed 03/20/2015   Patient denies    . Medical history non-contributory   . Migraine 03/30/2015  . Preventative health care 03/20/2015      Past Surgical History:  Procedure Laterality Date  . DILATION AND CURETTAGE OF UTERUS  2012  . TRIGGER FINGER RELEASE Right 02/25/2012   Procedure: MINOR RELEASE TRIGGER FINGER/A-1 PULLEY;  Surgeon: Harvie Junior, MD;  Location: [facility];  Service: Orthopedics;  Laterality: Right;      Family History  Problem Relation Age of Onset  . Cancer Mother        uterus, colon  . Hypertension Mother   . Colon cancer Mother   . Cancer Father        prostate  . Hypertension Father   . Heart disease Maternal Grandmother   . Other Maternal Grandfather        killed  in shooting  . Alzheimer's disease Paternal Grandmother        old age  . Emphysema Paternal Grandfather   . Esophageal cancer Neg Hx   . Stomach cancer Neg Hx   . Rectal cancer Neg Hx       Social History   Substance and Sexual Activity  Drug Use No  ,   Social History   Substance and Sexual Activity  Alcohol Use Yes  . Alcohol/week: 1.0 standard drinks  . 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 Biotin 5 MG CAPS Take 5,000 mg by mouth.     . calcium carbonate (OS-CAL) 600 MG TABS Take 600 mg by mouth 2 (two) times daily with a meal.    . Cholecalciferol (VITAMIN D PO) Take by mouth. Vit d3 5000 units-Take one daily    . NON FORMULARY Take 1 tablet by mouth daily. Isotonix OP-3    . NON FORMULARY Take 1 tablet by mouth daily. Calcium Plus    . rizatriptan (MAXALT) 10 MG tablet Take 10 mg by mouth as needed for migraine. May  repeat in 2 hours if needed    . zinc gluconate 50 MG tablet Take 50 mg by mouth daily.     Current Facility-Administered Medications on File Prior to Visit  Medication Dose Route Frequency Provider Last Rate Last Dose  . 0.9 %  sodium chloride infusion  500 mL Intravenous Continuous Iva BoopGessner, Carl E, MD        Allergies: Patient has no known allergies.  Review of Systems: General:   Denies fever, chills, unexplained weight loss.  Optho/Auditory:   Denies visual changes, blurred vision/LOV Respiratory:   Denies SOB, DOE more than baseline levels.  Cardiovascular:   Denies chest pain, palpitations, new onset peripheral edema  Gastrointestinal:   Denies nausea, vomiting, diarrhea.  Genitourinary: Denies dysuria, freq/ urgency, flank pain or discharge from genitals.  Endocrine:     Denies hot or cold intolerance, polyuria, polydipsia. Musculoskeletal:   Denies unexplained myalgias, joint swelling, unexplained arthralgias, gait problems.  Skin:  Denies rash, suspicious lesions Neurological:     Denies dizziness, unexplained weakness, numbness  Psychiatric/Behavioral:   Denies mood changes, suicidal or homicidal ideations, hallucinations    Objective:    Blood pressure 109/77, pulse 72, temperature 98.2 F (36.8 C), height 5\' 4"  (1.626 m), weight 109 lb 12.8 oz (49.8 kg), SpO2 98 %. Body mass index is 18.85 kg/m. General Appearance:    Alert, cooperative, no distress, appears stated age  Head:    Normocephalic, without obvious abnormality, atraumatic  Eyes:    PERRL, conjunctiva/corneas clear, EOM's intact, fundi    benign, both eyes  Ears:    Normal TM's and external ear canals, both ears  Nose:   Nares normal, septum midline, mucosa normal, no drainage    or sinus tenderness  Throat:   Lips w/o lesion, mucosa moist, and tongue normal; teeth and   gums normal  Neck:   Supple, symmetrical, trachea midline, no adenopathy;    thyroid:  no enlargement/tenderness/nodules; no carotid  bruit or JVD  Back:     Symmetric, no curvature, ROM normal, no CVA tenderness  Lungs:     Clear to auscultation bilaterally, respirations unlabored, no       Wh/ R/ R  Chest Wall:    No tenderness or gross deformity; normal excursion   Heart:    Regular rate and rhythm, S1 and S2 normal, no murmur, rub   or gallop  Breast Exam:    No tenderness, masses, or nipple abnormality b/l; no d/c  Abdomen:     Soft, non-tender, bowel sounds active all four quadrants, NO   G/R/R, no masses, no organomegaly  Genitalia:    Ext genitalia: without lesion, no rash or discharge, No         tenderness;  Cervix: WNL's w/o discharge or lesion;        Adnexa:  No tenderness or palpable masses   Rectal:    Normal tone, no masses or tenderness;   guaiac negative stool  Extremities:   Extremities normal, atraumatic, no cyanosis or gross edema  Pulses:   2+ and symmetric all extremities  Skin:   Warm, dry, Skin color, texture, turgor normal, no obvious rashes or lesions Psych: No HI/SI, judgement and insight good, Euthymic mood. Full Affect.  Neurologic:   CNII-XII intact, normal strength, sensation and reflexes    Throughout   DEXA:    [email or web address]   Based on the [place] FRAX tool, a 56 year old white woman with no other risk factors has a 9.3% 10-year risk for any osteoporotic fracture. White women between the ages of 50 and 22 years with equivalent or greater 10-year fracture risks based on specific risk factors include but are not limited to the following persons: 38) a 56 year old current smoker with a BMI less than 21 kg/m2, daily alcohol use, and parental fracture history; 84) a 56 year old woman with a parental fracture history; 83) a 56 year old woman with a BMI less than 21 kg/m2 and daily alcohol use; and 79) a 58 year old current smoker with daily alcohol use.   The FRAX tool also predicts 10-year fracture risks for black, Asian, and Hispanic women in the [place]. In general, estimated  fracture risks in nonwhite women are lower than those for white women of the same age. Although the USPSTF recommends using a 9.3% 10-year fracture risk threshold to screen women aged 32 to 47 years,

## 2018-03-03 LAB — COMPREHENSIVE METABOLIC PANEL
ALBUMIN: 4.8 g/dL (ref 3.8–4.9)
ALT: 9 IU/L (ref 0–32)
AST: 14 IU/L (ref 0–40)
Albumin/Globulin Ratio: 1.8 (ref 1.2–2.2)
Alkaline Phosphatase: 84 IU/L (ref 39–117)
BILIRUBIN TOTAL: 0.4 mg/dL (ref 0.0–1.2)
BUN / CREAT RATIO: 18 (ref 9–23)
BUN: 16 mg/dL (ref 6–24)
CO2: 24 mmol/L (ref 20–29)
CREATININE: 0.88 mg/dL (ref 0.57–1.00)
Calcium: 9.8 mg/dL (ref 8.7–10.2)
Chloride: 102 mmol/L (ref 96–106)
GFR calc non Af Amer: 74 mL/min/{1.73_m2} (ref >59)
GFR, EST AFRICAN AMERICAN: 86 mL/min/{1.73_m2} (ref >59)
GLOBULIN, TOTAL: 2.6 g/dL (ref 1.5–4.5)
Glucose: 85 mg/dL (ref 65–99)
Potassium: 4.3 mmol/L (ref 3.5–5.2)
SODIUM: 141 mmol/L (ref 134–144)
TOTAL PROTEIN: 7.4 g/dL (ref 6.0–8.5)

## 2018-03-03 LAB — CBC WITH DIFFERENTIAL/PLATELET
Basophils Absolute: 0 10*3/uL (ref 0.0–0.2)
Basos: 1 %
EOS (ABSOLUTE): 0.1 10*3/uL (ref 0.0–0.4)
EOS: 2 %
HEMATOCRIT: 40.5 % (ref 34.0–46.6)
HEMOGLOBIN: 13.7 g/dL (ref 11.1–15.9)
IMMATURE GRANS (ABS): 0 10*3/uL (ref 0.0–0.1)
Immature Granulocytes: 0 %
LYMPHS ABS: 1.6 10*3/uL (ref 0.7–3.1)
LYMPHS: 31 %
MCH: 29.1 pg (ref 26.6–33.0)
MCHC: 33.8 g/dL (ref 31.5–35.7)
MCV: 86 fL (ref 79–97)
MONOCYTES: 7 %
Monocytes Absolute: 0.4 10*3/uL (ref 0.1–0.9)
NEUTROS ABS: 3 10*3/uL (ref 1.4–7.0)
Neutrophils: 59 %
Platelets: 170 10*3/uL (ref 150–450)
RBC: 4.7 x10E6/uL (ref 3.77–5.28)
RDW: 12.9 % (ref 11.7–15.4)
WBC: 5.1 10*3/uL (ref 3.4–10.8)

## 2018-03-03 LAB — LIPID PANEL
Chol/HDL Ratio: 2.3 ratio (ref 0.0–4.4)
Cholesterol, Total: 224 mg/dL — ABNORMAL HIGH (ref 100–199)
HDL: 99 mg/dL (ref >39)
LDL Calculated: 110 mg/dL — ABNORMAL HIGH (ref 0–99)
Triglycerides: 73 mg/dL (ref 0–149)
VLDL Cholesterol Cal: 15 mg/dL (ref 5–40)

## 2018-03-03 LAB — VITAMIN D 25 HYDROXY (VIT D DEFICIENCY, FRACTURES): Vit D, 25-Hydroxy: 51.7 ng/mL (ref 30.0–100.0)

## 2018-03-03 LAB — TSH: TSH: 1.4 u[IU]/mL (ref 0.450–4.500)

## 2018-03-03 LAB — HEMOGLOBIN A1C
Est. average glucose Bld gHb Est-mCnc: 114 mg/dL
Hgb A1c MFr Bld: 5.6 % (ref 4.8–5.6)

## 2018-03-03 LAB — T4, FREE: FREE T4: 1.34 ng/dL (ref 0.82–1.77)

## 2019-02-15 LAB — HM PAP SMEAR: HM Pap smear: NEGATIVE

## 2020-01-14 ENCOUNTER — Telehealth: Payer: Self-pay | Admitting: Physician Assistant

## 2020-01-14 NOTE — Telephone Encounter (Signed)
inquired if we test for Covid. advised [facility]. thank you

## 2020-01-17 ENCOUNTER — Telehealth: Payer: Self-pay | Admitting: Physician Assistant

## 2020-01-17 NOTE — Telephone Encounter (Signed)
Patient symptoms onset was 01/10/20. Patient tested positive at home Monday. Patient advised she does not qualify for antibody infusion at this point since we are 7 days out from symptom onset. Patient spouse states she passed out 3 days ago and asked for advise. I advised him to go to ER for evaluation. They denied. Patient advised to rotate tylenol and ibuprofen every 6 hrs prn for fever, drink plenty of fluids, rest and monitor symptoms. Should she develop SOB or have a fever uncontrolled with meds go to ER or UC. Patient verbalized understanding. AS, CMA

## 2020-05-29 LAB — HM DEXA SCAN

## 2020-07-31 LAB — HM MAMMOGRAPHY

## 2021-05-22 LAB — HM PAP SMEAR: HM Pap smear: NORMAL

## 2021-06-11 ENCOUNTER — Encounter: Payer: Self-pay | Admitting: Physician Assistant

## 2021-06-11 ENCOUNTER — Ambulatory Visit (INDEPENDENT_AMBULATORY_CARE_PROVIDER_SITE_OTHER): Payer: Self-pay | Admitting: Physician Assistant

## 2021-06-11 VITALS — BP 103/69 | HR 80 | Temp 98.2°F | Ht 64.0 in | Wt 110.0 lb

## 2021-06-11 DIAGNOSIS — G43909 Migraine, unspecified, not intractable, without status migrainosus: Secondary | ICD-10-CM

## 2021-06-11 DIAGNOSIS — E7800 Pure hypercholesterolemia, unspecified: Secondary | ICD-10-CM

## 2021-06-11 DIAGNOSIS — Z7689 Persons encountering health services in other specified circumstances: Secondary | ICD-10-CM

## 2021-06-11 DIAGNOSIS — M810 Age-related osteoporosis without current pathological fracture: Secondary | ICD-10-CM

## 2021-06-11 DIAGNOSIS — E559 Vitamin D deficiency, unspecified: Secondary | ICD-10-CM

## 2021-06-11 NOTE — Assessment & Plan Note (Signed)
-  Reviewed lipid panel from 03/02/2018: HDL 99, LDL 110 -Will repeat lipid panel with annual physical.

## 2021-06-11 NOTE — Assessment & Plan Note (Signed)
-  Will request bone density report from [facility]. Recommend to continue with risedronate 150 mg once a month. Continue with calcium and vitamin D supplements.

## 2021-06-11 NOTE — Assessment & Plan Note (Signed)
-  Will collect Vitamin D with lab visit for annual physical.

## 2021-06-11 NOTE — Assessment & Plan Note (Signed)
-  Perimenopause related. No acute or recent headache. Will continue to monitor.

## 2021-06-11 NOTE — Assessment & Plan Note (Signed)
>>  ASSESSMENT AND PLAN FOR MIGRAINE WRITTEN ON 06/11/2021  2:22 PM BY ABONZA, MARITZA, PA-C  -Perimenopause related. No acute or recent headache. Will continue to monitor.

## 2021-06-11 NOTE — Progress Notes (Signed)
New Patient Office Visit  Subjective    Patient ID: Alicia Parrish, female    DOB: [birth year]  Age: 61 y.o. MRN: [id number]  CC: No chief complaint on file.   HPI Kelani Thunberg presents to re-establish care. Patient reports new medical diagnosis of osteoporosis and was started on risedronate 150 mg once a month November 2022. Patient reports taking calcium 600 mg and Vitamin D3. No new surgeries since last visit. Also reports migraines started around perimenopause and have seemed to resolved. Patient has never been a smoker.    Outpatient Encounter Medications as of 06/11/2021  Medication Sig   Biotin 5 MG CAPS Take 5,000 mg by mouth.    calcium carbonate (OS-CAL) 600 MG TABS Take 600 mg by mouth 2 (two) times daily with a meal.   Cholecalciferol (VITAMIN D PO) Take by mouth. Vit d3 5000 units-Take one daily   NON FORMULARY Take 1 tablet by mouth daily. Isotonix OP-3   NON FORMULARY Take 1 tablet by mouth daily. Calcium Plus   rizatriptan (MAXALT) 10 MG tablet Take 10 mg by mouth as needed for migraine. May repeat in 2 hours if needed   zinc gluconate 50 MG tablet Take 50 mg by mouth daily.   Facility-Administered Encounter Medications as of 06/11/2021  Medication   0.9 %  sodium chloride infusion    Past Medical History:  Diagnosis Date   Headache(784.0)    History of chicken pox    Hyperlipidemia, mixed 03/20/2015   Patient denies   Medical history non-contributory    Migraine 03/30/2015   Preventative health care 03/20/2015    Past Surgical History:  Procedure Laterality Date   DILATION AND CURETTAGE OF UTERUS  2012   TRIGGER FINGER RELEASE Right 02/25/2012   Procedure: MINOR RELEASE TRIGGER FINGER/A-1 PULLEY;  Surgeon: Alta Corning, MD;  Location: [place] [facility];  Service: Orthopedics;  Laterality: Right;    Family History  Problem Relation Age of Onset   Cancer Mother        uterus, colon   Hypertension Mother    Colon cancer Mother    Cancer Father         prostate   Hypertension Father    Heart disease Maternal Grandmother    Other Maternal Grandfather        killed in shooting   Alzheimer's disease Paternal 28        old age   Emphysema Paternal Grandfather    Esophageal cancer Neg Hx    Stomach cancer Neg Hx    Rectal cancer Neg Hx     Social History   Socioeconomic History   Marital status: Married    Spouse name: Not on file   Number of children: Not on file   Years of education: Not on file   Highest education level: Not on file  Occupational History   Not on file  Tobacco Use   Smoking status: Never   Smokeless tobacco: Never  Substance and Sexual Activity   Alcohol use: Yes    Alcohol/week: 1.0 standard drink    Types: 1 Cans of beer per week    Comment: rare   Drug use: No   Sexual activity: Not on file    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      Objective    BP 103/69 (BP Location: Left Arm, Patient Position: Sitting)   Pulse 80   Temp 98.2 F (36.8 C) (Oral)   Ht 5\' 4"  (1.626 m)   Wt 110 lb (49.9 kg)   BMI 18.88 kg/m   Physical Exam General:  Well Developed, well nourished, appropriate for stated age.  Neuro:  Alert and oriented,  extra-ocular muscles intact  HEENT:  Normocephalic, atraumatic, neck supple  Skin:  no gross rash, warm, pink. Cardiac:  RRR, S1 S2 Respiratory: CTA B/L  Vascular:  Ext warm, no cyanosis apprec.; cap RF less 2 sec. Psych:  No HI/SI, judgement and  insight good, Euthymic mood. Full Affect.      Assessment & Plan:   Problem List Items Addressed This Visit       Cardiovascular and Mediastinum   Migraine (Chronic)    -Perimenopause related. No acute or recent headache. Will continue to monitor.         Musculoskeletal and Integument   Osteoporosis - Primary    -Will request bone density report from [facility]. Recommend to continue with risedronate 150 mg once a month. Continue with calcium and vitamin D supplements.         Other   Vitamin D insufficiency (Chronic)    -Will collect Vitamin D with lab visit for annual physical.        Elevated LDL cholesterol level    -Reviewed lipid panel from 03/02/2018: HDL 99, LDL 110 -Will repeat lipid panel with annual physical.       Other Visit Diagnoses     Encounter to establish care           Return for CPE and FBW in 4-6 weeks few days prior .   Lorrene Reid, PA-C

## 2021-06-26 ENCOUNTER — Encounter: Payer: Self-pay | Admitting: Physician Assistant

## 2021-06-26 ENCOUNTER — Other Ambulatory Visit: Payer: Self-pay | Admitting: Physician Assistant

## 2021-06-26 DIAGNOSIS — E7800 Pure hypercholesterolemia, unspecified: Secondary | ICD-10-CM

## 2021-06-26 DIAGNOSIS — E559 Vitamin D deficiency, unspecified: Secondary | ICD-10-CM

## 2021-06-26 DIAGNOSIS — E7889 Other lipoprotein metabolism disorders: Secondary | ICD-10-CM

## 2021-06-26 DIAGNOSIS — Z0000 Encounter for general adult medical examination without abnormal findings: Secondary | ICD-10-CM

## 2021-07-03 ENCOUNTER — Other Ambulatory Visit: Payer: Self-pay

## 2021-07-07 ENCOUNTER — Other Ambulatory Visit: Payer: Self-pay

## 2021-07-07 DIAGNOSIS — E559 Vitamin D deficiency, unspecified: Secondary | ICD-10-CM

## 2021-07-07 DIAGNOSIS — Z0000 Encounter for general adult medical examination without abnormal findings: Secondary | ICD-10-CM

## 2021-07-07 DIAGNOSIS — E7889 Other lipoprotein metabolism disorders: Secondary | ICD-10-CM

## 2021-07-08 LAB — CBC
Hematocrit: 38.2 % (ref 34.0–46.6)
Hemoglobin: 13 g/dL (ref 11.1–15.9)
MCH: 29.2 pg (ref 26.6–33.0)
MCHC: 34 g/dL (ref 31.5–35.7)
MCV: 86 fL (ref 79–97)
Platelets: 140 10*3/uL — ABNORMAL LOW (ref 150–450)
RBC: 4.45 x10E6/uL (ref 3.77–5.28)
RDW: 12.9 % (ref 11.7–15.4)
WBC: 5.1 10*3/uL (ref 3.4–10.8)

## 2021-07-08 LAB — LIPID PANEL
Chol/HDL Ratio: 2.5 ratio (ref 0.0–4.4)
Cholesterol, Total: 185 mg/dL (ref 100–199)
HDL: 73 mg/dL (ref >39)
LDL Chol Calc (NIH): 100 mg/dL — ABNORMAL HIGH (ref 0–99)
Triglycerides: 62 mg/dL (ref 0–149)
VLDL Cholesterol Cal: 12 mg/dL (ref 5–40)

## 2021-07-08 LAB — COMPREHENSIVE METABOLIC PANEL
ALT: 11 IU/L (ref 0–32)
AST: 14 IU/L (ref 0–40)
Albumin/Globulin Ratio: 1.8 (ref 1.2–2.2)
Albumin: 4.3 g/dL (ref 3.8–4.9)
Alkaline Phosphatase: 79 IU/L (ref 44–121)
BUN/Creatinine Ratio: 14 (ref 9–23)
BUN: 11 mg/dL (ref 6–24)
Bilirubin Total: 0.2 mg/dL (ref 0.0–1.2)
CO2: 25 mmol/L (ref 20–29)
Calcium: 9.3 mg/dL (ref 8.7–10.2)
Chloride: 104 mmol/L (ref 96–106)
Creatinine, Ser: 0.79 mg/dL (ref 0.57–1.00)
Globulin, Total: 2.4 g/dL (ref 1.5–4.5)
Glucose: 84 mg/dL (ref 70–99)
Potassium: 4.2 mmol/L (ref 3.5–5.2)
Sodium: 140 mmol/L (ref 134–144)
Total Protein: 6.7 g/dL (ref 6.0–8.5)
eGFR: 86 mL/min/{1.73_m2} (ref >59)

## 2021-07-08 LAB — VITAMIN D 25 HYDROXY (VIT D DEFICIENCY, FRACTURES): Vit D, 25-Hydroxy: 86.8 ng/mL (ref 30.0–100.0)

## 2021-07-08 LAB — HEMOGLOBIN A1C
Est. average glucose Bld gHb Est-mCnc: 114 mg/dL
Hgb A1c MFr Bld: 5.6 % (ref 4.8–5.6)

## 2021-07-08 LAB — TSH: TSH: 2.38 u[IU]/mL (ref 0.450–4.500)

## 2021-07-10 ENCOUNTER — Ambulatory Visit (INDEPENDENT_AMBULATORY_CARE_PROVIDER_SITE_OTHER): Payer: Self-pay | Admitting: Physician Assistant

## 2021-07-10 ENCOUNTER — Encounter: Payer: Self-pay | Admitting: Physician Assistant

## 2021-07-10 VITALS — BP 108/68 | HR 90 | Temp 97.7°F | Ht 63.5 in | Wt 109.0 lb

## 2021-07-10 DIAGNOSIS — E7800 Pure hypercholesterolemia, unspecified: Secondary | ICD-10-CM

## 2021-07-10 DIAGNOSIS — Z0000 Encounter for general adult medical examination without abnormal findings: Secondary | ICD-10-CM

## 2021-07-10 DIAGNOSIS — D696 Thrombocytopenia, unspecified: Secondary | ICD-10-CM

## 2021-07-10 NOTE — Progress Notes (Signed)
Complete physical exam   Patient: Alicia Parrish   DOB: 1960/08/30   61 y.o. Female  MRN: [id number] Visit Date: 07/10/2021   Chief Complaint  Patient presents with   Annual Exam   Subjective    Alicia Parrish is a 61 y.o. female who presents today for a complete physical exam.  She reports consuming a general diet.  Tries to stay as active as possible.   She generally feels fairly well. She does not have additional problems to discuss today.     Past Medical History:  Diagnosis Date   Headache(784.0)    History of chicken pox    Hyperlipidemia, mixed 03/20/2015   Patient denies   Medical history non-contributory    Migraine 03/30/2015   Preventative health care 03/20/2015   Past Surgical History:  Procedure Laterality Date   DILATION AND CURETTAGE OF UTERUS  2012   TRIGGER FINGER RELEASE Right 02/25/2012   Procedure: MINOR RELEASE TRIGGER FINGER/A-1 PULLEY;  Surgeon: Alta Corning, MD;  Location: [facility];  Service: Orthopedics;  Laterality: Right;   Social History   Socioeconomic History   Marital status: Married    Spouse name: Not on file   Number of children: Not on file   Years of education: Not on file   Highest education level: Not on file  Occupational History   Not on file  Tobacco Use   Smoking status: Never   Smokeless tobacco: Never  Substance and Sexual Activity   Alcohol use: Yes    Alcohol/week: 1.0 standard drink of alcohol    Types: 1 Cans of beer per week    Comment: rare   Drug use: No   Sexual activity: Not on file    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    calcium carbonate (OS-CAL) 600 MG TABS Take 600 mg by mouth 2 (two) times daily with a meal.   Cholecalciferol (VITAMIN D PO) Take by mouth. Vit d3 5000 units-Take one daily   NON FORMULARY Take 1 tablet by mouth daily. Isotonix OP-3   NON FORMULARY Take 1 tablet by mouth daily. Calcium Plus   rizatriptan (MAXALT) 10 MG tablet Take 10 mg by mouth as needed for migraine. May repeat in 2 hours if needed   zinc gluconate 50 MG tablet Take 50 mg by mouth daily.   Facility-Administered Medications Prior to Visit  Medication Dose Route Frequency Provider   0.9 %  sodium chloride infusion  500 mL Intravenous Continuous Gatha Mayer, MD    Review of Systems Review of Systems:  A fourteen system review of systems was performed and found to be positive as per HPI.   Last CBC Lab Results  Component Value Date   WBC 5.1 07/07/2021   HGB 13.0 07/07/2021   HCT 38.2 07/07/2021   MCV 86 07/07/2021   MCH 29.2 07/07/2021   RDW 12.9 07/07/2021   PLT 140 (L)  75/91/6384   Last metabolic panel Lab Results  Component Value Date   GLUCOSE 84 07/07/2021   NA 140 07/07/2021   K 4.2 07/07/2021   CL 104 07/07/2021   CO2 25 07/07/2021   BUN 11 07/07/2021   CREATININE 0.79 07/07/2021   EGFR 86 07/07/2021   CALCIUM 9.3 07/07/2021   PROT 6.7 07/07/2021   ALBUMIN 4.3 07/07/2021   LABGLOB 2.4 07/07/2021   AGRATIO 1.8 07/07/2021   BILITOT 0.2 07/07/2021   ALKPHOS 79 07/07/2021   AST 14 07/07/2021   ALT 11 07/07/2021   Last lipids Lab Results  Component Value Date   CHOL 185 07/07/2021   HDL 73 07/07/2021   LDLCALC 100 (H) 07/07/2021   LDLDIRECT 117.6 08/24/2012   TRIG 62 07/07/2021   CHOLHDL 2.5 07/07/2021   Last hemoglobin A1c Lab Results  Component Value Date   HGBA1C 5.6 07/07/2021   Last thyroid functions Lab Results  Component Value  Date   TSH 2.380 07/07/2021   Last vitamin D Lab Results  Component Value Date   VD25OH 86.8 07/07/2021    Objective     BP 108/68   Pulse 90   Temp 97.7 F (36.5 C)   Ht 5' 3.5" (1.613 m)   Wt 109 lb (49.4 kg)   SpO2 95%   BMI 19.01 kg/m  BP Readings from Last 3 Encounters:  07/10/21 108/68  06/11/21 103/69  03/02/18 109/77   Wt Readings from Last 3 Encounters:  07/10/21 109 lb (49.4 kg)  06/11/21 110 lb (49.9 kg)  03/02/18 109 lb 12.8 oz (49.8 kg)    Physical Exam   General Appearance:     Alert, cooperative, in no acute distress, appears stated age   Head:    Normocephalic, without obvious abnormality, atraumatic  Eyes:    PERRL, conjunctiva/corneas clear, EOM's intact, fundi    benign, both eyes  Ears:    Normal TM's and external ear canals, both ears  Nose:   Nares normal, septum midline, mucosa normal, no drainage    or sinus tenderness  Throat:   Lips, mucosa, and tongue normal; teeth and gums normal  Neck:   Supple, symmetrical, trachea midline, no adenopathy;    thyroid:  no enlargement/tenderness/nodules; no JVD  Back:     Symmetric, no curvature, ROM normal, no CVA tenderness  Lungs:     Clear to auscultation bilaterally, respirations unlabored  Chest Wall:    No tenderness or deformity   Heart:    Normal heart rate. Normal rhythm. No murmurs, rubs, or gallops.   Breast Exam:    deferred  Abdomen:     Soft, non-tender, bowel sounds active all four quadrants,    no masses, no organomegaly  Pelvic:    deferred  Extremities:   All extremities are intact. No cyanosis or edema  Pulses:   2+ and symmetric all extremities  Skin:   Skin color, texture, turgor normal, no rashes or lesions  Lymph nodes:   Cervical and supraclavicular nodes normal  Neurologic:   CNII-XII grossly intact.     Last depression screening scores    06/11/2021    2:02 PM 03/02/2018    8:19 AM 06/30/2017    9:46 AM  PHQ 2/9 Scores  PHQ - 2 Score 0 0 0  PHQ- 9 Score 0 0 0    Last fall risk screening    07/10/2021   11:02 AM  Fall Risk   Falls in the past year? 0  Number falls in past yr: 0  Injury with Fall? 0  Risk for fall due to : No Fall Risks  Follow up Falls evaluation completed     No results found for any visits on 07/10/21.  Assessment & Plan    Routine Health Maintenance and Physical Exam  Exercise Activities and Dietary recommendations -Discussed heart healthy diet low in fat and carbohydrates.   Immunization History  Administered Date(s) Administered   Tdap 03/10/2016    Health Maintenance  Topic Date Due   COVID-19 Vaccine (1) Never done   HIV Screening  Never done   Hepatitis C Screening  Never done   Zoster Vaccines- Shingrix (1 of 2) Never done   INFLUENZA VACCINE  08/11/2021   MAMMOGRAM  08/01/2022   PAP SMEAR-Modifier  05/22/2024   TETANUS/TDAP  03/10/2026   COLONOSCOPY (Pts 45-47yr Insurance coverage will need to be confirmed)  05/29/2026   HPV VACCINES  Aged Out    Discussed health benefits of physical activity, and encouraged her to engage in regular exercise appropriate for her age and condition.  Problem List Items Addressed This Visit       Other   Elevated LDL cholesterol level   Other Visit Diagnoses     Healthcare maintenance    -  Primary   Low platelet count (HCC)          Discussed with patient most recent lab results which are essentially within normal limits or stable from prior. Cholesterol panel has improved. LDL borderline. Discussed heart healthy diet. Discussed mildly low platelet count, pt denies excessive bruising or bleeding. Pt is self-pay so will defer repeating CBC at this time unless she notices s/sx. Pt verbalized understanding and agreeable.  UTD mammogram, pap, colonoscopy, and Tdap. Continue to follow-up with OB/GYN ([facility]).  Return in about 1 year (around 07/11/2022) for CPE and FBW.       MLorrene Reid PA-C  [phone](phone) [phone](fax)  [place]

## 2021-07-10 NOTE — Patient Instructions (Signed)
Preventive Care 40-59 Years Old, Female Preventive care refers to lifestyle choices and visits with your health care provider that can promote health and wellness. Preventive care visits are also called wellness exams. What can I expect for my preventive care visit? Counseling Your health care provider may ask you questions about your: Medical history, including: Past medical problems. Family medical history. Pregnancy history. Current health, including: Menstrual cycle. Method of birth control. Emotional well-being. Home life and relationship well-being. Sexual activity and sexual health. Lifestyle, including: Alcohol, nicotine or tobacco, and drug use. Access to firearms. Diet, exercise, and sleep habits. Work and work environment. Sunscreen use. Safety issues such as seatbelt and bike helmet use. Physical exam Your health care provider will check your: Height and weight. These may be used to calculate your BMI (body mass index). BMI is a measurement that tells if you are at a healthy weight. Waist circumference. This measures the distance around your waistline. This measurement also tells if you are at a healthy weight and may help predict your risk of certain diseases, such as type 2 diabetes and high blood pressure. Heart rate and blood pressure. Body temperature. Skin for abnormal spots. What immunizations do I need?  Vaccines are usually given at various ages, according to a schedule. Your health care provider will recommend vaccines for you based on your age, medical history, and lifestyle or other factors, such as travel or where you work. What tests do I need? Screening Your health care provider may recommend screening tests for certain conditions. This may include: Lipid and cholesterol levels. Diabetes screening. This is done by checking your blood sugar (glucose) after you have not eaten for a while (fasting). Pelvic exam and Pap test. Hepatitis B test. Hepatitis C  test. HIV (human immunodeficiency virus) test. STI (sexually transmitted infection) testing, if you are at risk. Lung cancer screening. Colorectal cancer screening. Mammogram. Talk with your health care provider about when you should start having regular mammograms. This may depend on whether you have a family history of breast cancer. BRCA-related cancer screening. This may be done if you have a family history of breast, ovarian, tubal, or peritoneal cancers. Bone density scan. This is done to screen for osteoporosis. Talk with your health care provider about your test results, treatment options, and if necessary, the need for more tests. Follow these instructions at home: Eating and drinking  Eat a diet that includes fresh fruits and vegetables, whole grains, lean protein, and low-fat dairy products. Take vitamin and mineral supplements as recommended by your health care provider. Do not drink alcohol if: Your health care provider tells you not to drink. You are pregnant, may be pregnant, or are planning to become pregnant. If you drink alcohol: Limit how much you have to 0-1 drink a day. Know how much alcohol is in your drink. In the [place] one drink equals one 12 oz bottle of beer (355 mL), one 5 oz glass of wine (148 mL), or one 1 oz glass of hard liquor (44 mL). Lifestyle Brush your teeth every morning and night with fluoride toothpaste. Floss one time each day. Exercise for at least 30 minutes 5 or more days each week. Do not use any products that contain nicotine or tobacco. These products include cigarettes, chewing tobacco, and vaping devices, such as e-cigarettes. If you need help quitting, ask your health care provider. Do not use drugs. If you are sexually active, practice safe sex. Use a condom or other form of protection to   prevent STIs. If you do not wish to become pregnant, use a form of birth control. If you plan to become pregnant, see your health care provider for a  prepregnancy visit. Take aspirin only as told by your health care provider. Make sure that you understand how much to take and what form to take. Work with your health care provider to find out whether it is safe and beneficial for you to take aspirin daily. Find healthy ways to manage stress, such as: Meditation, yoga, or listening to music. Journaling. Talking to a trusted person. Spending time with friends and family. Minimize exposure to UV radiation to reduce your risk of skin cancer. Safety Always wear your seat belt while driving or riding in a vehicle. Do not drive: If you have been drinking alcohol. Do not ride with someone who has been drinking. When you are tired or distracted. While texting. If you have been using any mind-altering substances or drugs. Wear a helmet and other protective equipment during sports activities. If you have firearms in your house, make sure you follow all gun safety procedures. Seek help if you have been physically or sexually abused. What's next? Visit your health care provider once a year for an annual wellness visit. Ask your health care provider how often you should have your eyes and teeth checked. Stay up to date on all vaccines. This information is not intended to replace advice given to you by your health care provider. Make sure you discuss any questions you have with your health care provider. Document Revised: 06/25/2020 Document Reviewed: 06/25/2020 Elsevier Patient Education  [place].

## 2022-07-09 ENCOUNTER — Encounter: Payer: Self-pay | Admitting: Family Medicine

## 2022-07-09 ENCOUNTER — Ambulatory Visit (INDEPENDENT_AMBULATORY_CARE_PROVIDER_SITE_OTHER): Payer: Self-pay | Admitting: Family Medicine

## 2022-07-09 VITALS — BP 107/72 | HR 76 | Resp 18 | Ht 63.5 in | Wt 108.0 lb

## 2022-07-09 DIAGNOSIS — E559 Vitamin D deficiency, unspecified: Secondary | ICD-10-CM

## 2022-07-09 DIAGNOSIS — E7800 Pure hypercholesterolemia, unspecified: Secondary | ICD-10-CM

## 2022-07-09 DIAGNOSIS — Z0000 Encounter for general adult medical examination without abnormal findings: Secondary | ICD-10-CM

## 2022-07-09 DIAGNOSIS — Z1159 Encounter for screening for other viral diseases: Secondary | ICD-10-CM

## 2022-07-09 DIAGNOSIS — M810 Age-related osteoporosis without current pathological fracture: Secondary | ICD-10-CM

## 2022-07-09 DIAGNOSIS — G43909 Migraine, unspecified, not intractable, without status migrainosus: Secondary | ICD-10-CM

## 2022-07-09 NOTE — Patient Instructions (Signed)
You can schedule a nonfasting lab appointment in about 6 months to take care of the viral screenings we discussed.    Otherwise, keep up the great work and I will see you for your annual physical next year!

## 2022-07-09 NOTE — Assessment & Plan Note (Signed)
Continue rizatriptan 10 mg as needed for acute relief of migraines.

## 2022-07-09 NOTE — Progress Notes (Signed)
Complete physical exam  Patient: Alicia Parrish   DOB: 1960/09/13   62 y.o. Female  MRN: [id number]  Subjective:    Chief Complaint  Patient presents with   Annual Exam    Fasting    Cyndi Dewolf is a 62 y.o. female who presents today for a complete physical exam. She reports consuming a general diet.  She stays active with yard work and working out a few days a week.  She generally feels well. She reports sleeping well. She does not have additional problems to discuss today.    Most recent fall risk assessment:    07/09/2022    8:39 AM  Fall Risk   Falls in the past year? 0  Number falls in past yr: 0  Injury with Fall? 0  Risk for fall due to : No Fall Risks  Follow up Falls evaluation completed     Most recent depression and anxiety screenings:    07/09/2022    8:39 AM 06/11/2021    2:02 PM  PHQ 2/9 Scores  PHQ - 2 Score 0 0  PHQ- 9 Score 0 0      07/10/2021   11:03 AM 06/11/2021    2:03 PM  GAD 7 : Generalized Anxiety Score  Nervous, Anxious, on Edge 0 0  Control/stop worrying 0 0  Worry too much - different things 0 0  Trouble relaxing 0 0  Restless 0 0  Easily annoyed or irritable 0 0  Afraid - awful might happen 0 0  Total GAD 7 Score 0 0  Anxiety Difficulty Not difficult at all Not difficult at all    Patient Active Problem List   Diagnosis Date Noted   Elevated LDL cholesterol level 06/30/2017   Osteoporosis 06/30/2017   Vitamin D insufficiency 06/17/2016   Elevated HDL 06/17/2016   Migraine without status migrainosus, not intractable 03/30/2015   History of chicken pox    Pigmented skin lesions 05/18/2012    Past Surgical History:  Procedure Laterality Date   DILATION AND CURETTAGE OF UTERUS  2012   TRIGGER FINGER RELEASE Right 02/25/2012   Procedure: MINOR RELEASE TRIGGER FINGER/A-1 PULLEY;  Surgeon: Harvie Junior, MD;  Location: [facility];  Service: Orthopedics;  Laterality: Right;   Social History   Tobacco Use   Smoking status:  Never    Passive exposure: Never   Smokeless tobacco: Never  Substance Use Topics   Alcohol use: Yes    Alcohol/week: 1.0 standard drink of alcohol    Types: 1 Cans of beer per week    Comment: rare   Drug use: No   Family History  Problem Relation Age of Onset   Cancer Mother        uterus, colon   Hypertension Mother    Colon cancer Mother    Cancer Father        prostate   Hypertension Father    Heart disease Maternal Grandmother    Other Maternal Grandfather        killed in shooting   Alzheimer's disease Paternal Grandmother        old age   Emphysema Paternal Grandfather    Esophageal cancer Neg Hx    Stomach cancer Neg Hx    Rectal cancer Neg Hx    No Known Allergies   Patient Care Team: Melida Quitter, PA as PCP - General (Family Medicine) Francee Piccolo, MD as Consulting Physician (Ophthalmology) Santiago Glad, MD as Referring Physician (  Neurology) Jodi Geralds, MD as Consulting Physician (Orthopedic Surgery) Iva Boop, MD as Consulting Physician (Gastroenterology) Farris Has, MD as Referring Physician (Dermatology) Candice Camp, MD as Consulting Physician (Obstetrics and Gynecology) Christia Reading, MD as Consulting Physician (Otolaryngology)   Outpatient Medications Prior to Visit  Medication Sig   Biotin 5 MG CAPS Take 5,000 mg by mouth.    calcium carbonate (OS-CAL) 600 MG TABS Take 600 mg by mouth 2 (two) times daily with a meal.   Cholecalciferol (VITAMIN D PO) Take by mouth. Vit d3 5000 units-Take one daily   NON FORMULARY Take 1 tablet by mouth daily. Isotonix OP-3   NON FORMULARY Take 1 tablet by mouth daily. Calcium Plus   risedronate (ACTONEL) 150 MG tablet Take 150 mg by mouth every 30 (thirty) days.   rizatriptan (MAXALT) 10 MG tablet Take 10 mg by mouth as needed for migraine. May repeat in 2 hours if needed   zinc gluconate 50 MG tablet Take 50 mg by mouth daily.   Facility-Administered Medications Prior to Visit   Medication Dose Route Frequency Provider   0.9 %  sodium chloride infusion  500 mL Intravenous Continuous Iva Boop, MD    Review of Systems  Constitutional:  Negative for chills, fever and malaise/fatigue.  HENT:  Negative for congestion and hearing loss.   Eyes:  Negative for blurred vision and double vision.  Respiratory:  Negative for cough and shortness of breath.   Cardiovascular:  Negative for chest pain, palpitations and leg swelling.  Gastrointestinal:  Negative for abdominal pain, constipation, diarrhea and heartburn.  Genitourinary:  Negative for frequency and urgency.  Musculoskeletal:  Negative for myalgias and neck pain.  Neurological:  Negative for headaches.  Endo/Heme/Allergies:  Negative for polydipsia.  Psychiatric/Behavioral:  Negative for depression. The patient is not nervous/anxious and does not have insomnia.       Objective:    BP 107/72 (BP Location: Right Arm, Patient Position: Sitting, Cuff Size: Normal)   Pulse 76   Resp 18   Ht 5' 3.5" (1.613 m)   Wt 108 lb (49 kg)   SpO2 96%   BMI 18.83 kg/m    Physical Exam Constitutional:      General: She is not in acute distress.    Appearance: Normal appearance.  HENT:     Head: Normocephalic and atraumatic.     Right Ear: Tympanic membrane, ear canal and external ear normal. There is no impacted cerumen.     Left Ear: Tympanic membrane, ear canal and external ear normal. There is no impacted cerumen.     Nose: Rhinorrhea present.     Mouth/Throat:     Mouth: Mucous membranes are moist.     Pharynx: No oropharyngeal exudate or posterior oropharyngeal erythema.  Eyes:     Extraocular Movements: Extraocular movements intact.     Conjunctiva/sclera: Conjunctivae normal.     Pupils: Pupils are equal, round, and reactive to light.  Neck:     Thyroid: No thyroid mass, thyromegaly or thyroid tenderness.  Cardiovascular:     Rate and Rhythm: Normal rate and regular rhythm.     Heart sounds: Normal  heart sounds. No murmur heard.    No friction rub. No gallop.  Pulmonary:     Effort: Pulmonary effort is normal. No respiratory distress.     Breath sounds: Normal breath sounds. No wheezing, rhonchi or rales.  Abdominal:     General: Abdomen is flat. Bowel sounds are normal. There is no  distension.     Palpations: Abdomen is soft. There is no mass.     Tenderness: There is no abdominal tenderness.  Musculoskeletal:        General: Normal range of motion.     Cervical back: Normal range of motion and neck supple.  Lymphadenopathy:     Cervical: No cervical adenopathy.  Skin:    General: Skin is warm and dry.  Neurological:     Mental Status: She is alert and oriented to person, place, and time.     Cranial Nerves: No cranial nerve deficit.     Motor: No weakness.     Deep Tendon Reflexes: Reflexes normal.  Psychiatric:        Mood and Affect: Mood normal.       Assessment & Plan:    Routine Health Maintenance and Physical Exam  Immunization History  Administered Date(s) Administered   Tdap 03/10/2016    Health Maintenance  Topic Date Due   COVID-19 Vaccine (1) Never done   HIV Screening  Never done   Hepatitis C Screening  Never done   Zoster Vaccines- Shingrix (1 of 2) Never done   MAMMOGRAM  08/01/2022   INFLUENZA VACCINE  08/12/2022   PAP SMEAR-Modifier  05/22/2024   DTaP/Tdap/Td (2 - Td or Tdap) 03/10/2026   Colonoscopy  05/29/2026   HPV VACCINES  Aged Out   Discussed need for shingles vaccines, patient is not interested.  Provided education and informational handouts for her to consider. She is currently uninsured and as such would like to spread out her labs due to cost.  We will collect routine labs today, and she will return in 6 months for a nonfasting lab appointment to complete HIV and hepatitis C screenings.  Discussed health benefits of physical activity, and encouraged her to engage in regular exercise appropriate for her age and condition.  Wellness  examination -     CBC with Differential/Platelet; Future -     Comprehensive metabolic panel; Future -     Hemoglobin A1c; Future -     Lipid panel; Future -     VITAMIN D 25 Hydroxy (Vit-D Deficiency, Fractures); Future -     TSH; Future -     T4, free; Future  Vitamin D insufficiency -     VITAMIN D 25 Hydroxy (Vit-D Deficiency, Fractures); Future  Elevated LDL cholesterol level -     Lipid panel; Future  Osteoporosis, unspecified osteoporosis type, unspecified pathological fracture presence Assessment & Plan: Continue risedronate 150 mg once a month, calcium and vitamin D supplements.  Orders: -     CBC with Differential/Platelet; Future -     Comprehensive metabolic panel; Future -     VITAMIN D 25 Hydroxy (Vit-D Deficiency, Fractures); Future  Migraine without status migrainosus, not intractable, unspecified migraine type Assessment & Plan: Continue rizatriptan 10 mg as needed for acute relief of migraines.   Screening for viral disease -     Hepatitis C antibody; Future -     HIV Antibody (routine testing w rflx); Future   Return in about 6 months for hepatitis C/HIV screening labs. Return in about 1 year (around 07/09/2023) for annual physical, fasting blood work 1 week before.   Melida Quitter, PA

## 2022-07-09 NOTE — Assessment & Plan Note (Signed)
Continue risedronate 150 mg once a month, calcium and vitamin D supplements.

## 2022-07-10 LAB — CBC WITH DIFFERENTIAL/PLATELET
Basophils Absolute: 0 10*3/uL (ref 0.0–0.2)
Basos: 0 %
EOS (ABSOLUTE): 0.1 10*3/uL (ref 0.0–0.4)
Eos: 1 %
Hematocrit: 41 % (ref 34.0–46.6)
Hemoglobin: 13.1 g/dL (ref 11.1–15.9)
Immature Grans (Abs): 0 10*3/uL (ref 0.0–0.1)
Immature Granulocytes: 0 %
Lymphocytes Absolute: 1.6 10*3/uL (ref 0.7–3.1)
Lymphs: 32 %
MCH: 28.7 pg (ref 26.6–33.0)
MCHC: 32 g/dL (ref 31.5–35.7)
MCV: 90 fL (ref 79–97)
Monocytes Absolute: 0.3 10*3/uL (ref 0.1–0.9)
Monocytes: 5 %
Neutrophils Absolute: 3.1 10*3/uL (ref 1.4–7.0)
Neutrophils: 62 %
Platelets: 144 10*3/uL — ABNORMAL LOW (ref 150–450)
RBC: 4.56 x10E6/uL (ref 3.77–5.28)
RDW: 12.9 % (ref 11.7–15.4)
WBC: 5.1 10*3/uL (ref 3.4–10.8)

## 2022-07-10 LAB — COMPREHENSIVE METABOLIC PANEL WITH GFR
ALT: 12 IU/L (ref 0–32)
AST: 17 IU/L (ref 0–40)
Albumin: 4.2 g/dL (ref 3.8–4.9)
Alkaline Phosphatase: 73 IU/L (ref 44–121)
BUN/Creatinine Ratio: 17 (ref 12–28)
BUN: 13 mg/dL (ref 8–27)
Bilirubin Total: 0.3 mg/dL (ref 0.0–1.2)
CO2: 25 mmol/L (ref 20–29)
Calcium: 9.2 mg/dL (ref 8.7–10.3)
Chloride: 106 mmol/L (ref 96–106)
Creatinine, Ser: 0.76 mg/dL (ref 0.57–1.00)
Globulin, Total: 2.7 g/dL (ref 1.5–4.5)
Glucose: 88 mg/dL (ref 70–99)
Potassium: 4 mmol/L (ref 3.5–5.2)
Sodium: 142 mmol/L (ref 134–144)
Total Protein: 6.9 g/dL (ref 6.0–8.5)
eGFR: 90 mL/min/1.73 (ref >59)

## 2022-07-10 LAB — T4, FREE: Free T4: 1.23 ng/dL (ref 0.82–1.77)

## 2022-07-10 LAB — LIPID PANEL
Chol/HDL Ratio: 2.2 ratio (ref 0.0–4.4)
Cholesterol, Total: 197 mg/dL (ref 100–199)
HDL: 89 mg/dL (ref >39)
LDL Chol Calc (NIH): 99 mg/dL (ref 0–99)
Triglycerides: 46 mg/dL (ref 0–149)
VLDL Cholesterol Cal: 9 mg/dL (ref 5–40)

## 2022-07-10 LAB — HEMOGLOBIN A1C
Est. average glucose Bld gHb Est-mCnc: 120 mg/dL
Hgb A1c MFr Bld: 5.8 % — ABNORMAL HIGH (ref 4.8–5.6)

## 2022-07-10 LAB — TSH: TSH: 1.3 u[IU]/mL (ref 0.450–4.500)

## 2022-07-10 LAB — VITAMIN D 25 HYDROXY (VIT D DEFICIENCY, FRACTURES): Vit D, 25-Hydroxy: 61.8 ng/mL (ref 30.0–100.0)

## 2022-07-13 ENCOUNTER — Telehealth: Payer: Self-pay

## 2022-07-13 DIAGNOSIS — D696 Thrombocytopenia, unspecified: Secondary | ICD-10-CM

## 2022-07-13 NOTE — Telephone Encounter (Signed)
Pt is requesting a callback from [facility] regarding information sent to MyChart.  [phone]

## 2022-07-14 NOTE — Telephone Encounter (Signed)
Spoke with patient on the phone, she wanted to let me know that she had COVID-19 in 2022 and did take ivermectin.  She did not know if either of those were correlated to changes in platelet levels but wanted me to have all of the information.  She is opening to having a repeat CBC with peripheral smear drawn and having a referral placed to hematology.  Please call her to get a nonfasting lab appointment scheduled in the next couple of weeks.  Thank you!

## 2022-07-14 NOTE — Telephone Encounter (Signed)
Pt calling to inquire about when provider will be calling her.  She said she sat around all day yesterday and never heard from provider.

## 2022-07-14 NOTE — Addendum Note (Signed)
Addended by: Saralyn Pilar on: 07/14/2022 11:39 AM   Modules accepted: Orders

## 2022-07-22 ENCOUNTER — Other Ambulatory Visit: Payer: Self-pay

## 2022-07-22 DIAGNOSIS — D696 Thrombocytopenia, unspecified: Secondary | ICD-10-CM

## 2022-07-26 LAB — PATHOLOGIST SMEAR REVIEW
Basophils Absolute: 0 10*3/uL (ref 0.0–0.2)
Basos: 1 %
EOS (ABSOLUTE): 0.1 10*3/uL (ref 0.0–0.4)
Eos: 1 %
Hematocrit: 41.1 % (ref 34.0–46.6)
Hemoglobin: 13.8 g/dL (ref 11.1–15.9)
Immature Grans (Abs): 0 10*3/uL (ref 0.0–0.1)
Immature Granulocytes: 0 %
Lymphocytes Absolute: 1.7 10*3/uL (ref 0.7–3.1)
Lymphs: 27 %
MCH: 29 pg (ref 26.6–33.0)
MCHC: 33.6 g/dL (ref 31.5–35.7)
MCV: 86 fL (ref 79–97)
Monocytes Absolute: 0.3 10*3/uL (ref 0.1–0.9)
Monocytes: 6 %
Neutrophils Absolute: 4.1 10*3/uL (ref 1.4–7.0)
Neutrophils: 65 %
Platelets: 158 10*3/uL (ref 150–450)
RBC: 4.76 x10E6/uL (ref 3.77–5.28)
RDW: 13 % (ref 11.7–15.4)
WBC: 6.2 10*3/uL (ref 3.4–10.8)

## 2022-08-05 ENCOUNTER — Other Ambulatory Visit: Payer: Self-pay

## 2022-08-05 ENCOUNTER — Encounter: Payer: Self-pay | Admitting: Hematology and Oncology

## 2022-09-30 LAB — HM DEXA SCAN

## 2022-09-30 LAB — HM MAMMOGRAPHY

## 2023-02-17 ENCOUNTER — Encounter: Payer: Self-pay | Admitting: Family Medicine

## 2023-03-10 ENCOUNTER — Ambulatory Visit: Payer: Self-pay | Admitting: Family Medicine

## 2023-03-10 ENCOUNTER — Telehealth: Payer: Self-pay | Admitting: *Deleted

## 2023-03-10 ENCOUNTER — Telehealth (INDEPENDENT_AMBULATORY_CARE_PROVIDER_SITE_OTHER): Payer: Self-pay | Admitting: Family Medicine

## 2023-03-10 ENCOUNTER — Encounter: Payer: Self-pay | Admitting: Family Medicine

## 2023-03-10 DIAGNOSIS — M25571 Pain in right ankle and joints of right foot: Secondary | ICD-10-CM

## 2023-03-10 NOTE — Telephone Encounter (Signed)
 Patient had appt today with Dr. Selena Batten

## 2023-03-10 NOTE — Progress Notes (Addendum)
 Virtual Visit via Video Note  I connected with Alicia Parrish  on 03/10/23 at 12:40 PM EST by a video enabled telemedicine application and verified that I am speaking with the correct person using two identifiers.  Location patient: [place] Location provider:work or home office Persons participating in the virtual visit: patient, provider  I discussed the limitations and requested verbal permission for telemedicine visit. The patient expressed understanding and agreed to proceed. She also understand bill will be sent for the visit and agrees/request this.    HPI:  Acute telemedicine visit for selling R ankle: -Onset: 1 week ago -Symptoms include: mild swelling, mild discomfort with walking, now improving -walks about 1 mile 5 days per week -she thinks she pulled it or something bit her - she did walk in some different rain boots before it happened -Denies:fevers, chills, malaise, weakness, numbness, joint issues elsewhere, rash, hx of gout or any ofther symptoms, she denies seeing any break in the skin or insect bite, blister, etc - but she did wonder if she had been biten by something or if this was a strain or sprain -Pertinent past medical history: see below -Pertinent medication allergies:No Known Allergies -COVID-19 vaccine status:  Immunization History  Administered Date(s) Administered   Tdap 03/10/2016     ROS: See pertinent positives and negatives per HPI.  Past Medical History:  Diagnosis Date   Headache(784.0)    History of chicken pox    Hyperlipidemia, mixed 03/20/2015   Patient denies   Medical history non-contributory    Migraine 03/30/2015   Preventative health care 03/20/2015    Past Surgical History:  Procedure Laterality Date   DILATION AND CURETTAGE OF UTERUS  2012   TRIGGER FINGER RELEASE Right 02/25/2012   Procedure: MINOR RELEASE TRIGGER FINGER/A-1 PULLEY;  Surgeon: Harvie Junior, MD;  Location: [facility];  Service: Orthopedics;  Laterality: Right;      Current Outpatient Medications:    Biotin 5 MG CAPS, Take 5,000 mg by mouth. , Disp: , Rfl:    calcium carbonate (OS-CAL) 600 MG TABS, Take 600 mg by mouth 2 (two) times daily with a meal., Disp: , Rfl:    Cholecalciferol (VITAMIN D PO), Take by mouth. Vit d3 5000 units-Take one daily, Disp: , Rfl:    NON FORMULARY, Take 1 tablet by mouth daily. Isotonix OP-3, Disp: , Rfl:    NON FORMULARY, Take 1 tablet by mouth daily. Calcium Plus, Disp: , Rfl:    risedronate (ACTONEL) 150 MG tablet, Take 150 mg by mouth every 30 (thirty) days., Disp: , Rfl:    rizatriptan (MAXALT) 10 MG tablet, Take 10 mg by mouth as needed for migraine. May repeat in 2 hours if needed, Disp: , Rfl:    zinc gluconate 50 MG tablet, Take 50 mg by mouth daily., Disp: , Rfl:   Current Facility-Administered Medications:    0.9 %  sodium chloride infusion, 500 mL, Intravenous, Continuous, Leone Payor, Maryjean Morn, MD  EXAM:  VITALS per patient if applicable:  GENERAL: alert, oriented, appears well and in no acute distress  HEENT: atraumatic, conjunttiva clear, no obvious abnormalities on inspection of external nose and ears  NECK: normal movements of the head and neck  LUNGS: on inspection no signs of respiratory distress, breathing rate appears normal, no obvious gross SOB, gasping or wheezing  CV: no obvious cyanosis  MS: moves all visible extremities without noticeable abnormality - on video visit exam she looks to have some possible mild edema and ? Erythema  around the R lateral ankle jt, denies TTP as she self palpates and is able to dosi/plantar flex and bear weight  PSYCH/NEURO: pleasant and cooperative, no obvious depression or anxiety, speech and thought processing grossly intact  ASSESSMENT AND PLAN:  Discussed the following assessment and plan:  Acute right ankle pain  -we discussed possible serious and likely etiologies, options for evaluation and workup, limitations of telemedicine visit vs in person  visit, treatment, treatment risks and precautions. Pt is agreeable to treatment via telemedicine at this moment. Query strain/sprain vs other. Since she feels is improving she opted to try elevation, compression, nsaid for a few days. She agrees to seek prompt in person care if worsening, new symptoms arise, or if is not improving with treatment with full resolution over the next several days. Discussed options for follow up care including orthopedic walk in clinics. Did let this patient know that I do telemedicine on Tuesdays and Thursdays for [facility] and those are the days I am logged into the system. Advised to schedule follow up visit with PCP, [facility] virtual visits or [facility] if any further questions or concerns to avoid delays in care.   I discussed the assessment and treatment plan with the patient. The patient was provided an opportunity to ask questions and all were answered. The patient agreed with the plan and demonstrated an understanding of the instructions.     Terressa Koyanagi, DO

## 2023-03-10 NOTE — Patient Instructions (Signed)
-  elevate 30 minutes per day  -Aleve 1 tablet 1-2 times daily if needed  -drink plenty of water  I hope you are feeling better soon!  Seek in person care at your doctor office or at orthopedic office promptly if your symptoms worsen, new concerns arise or you are not improving with treatment over the next few days with resolution of symptoms.   It was nice to meet you today. I help [facility] out with telemedicine visits on Tuesdays and Thursdays and am happy to help if you need a virtual follow up visit on those days. Otherwise, if you have any concerns or questions following this visit please schedule a follow up visit with your Primary Care office or seek care at a local urgent care clinic to avoid delays in care. If you are having severe or life threatening symptoms please call 911 and/or go to the nearest emergency room.

## 2023-03-10 NOTE — Telephone Encounter (Signed)
 Chief Complaint: Possible insect bite on right ankle Symptoms: Redness, tenderness, swelling Frequency: Ongoing since yesterday Pertinent Negatives: Patient denies pain, itching, difficulty breathing, hives Disposition: [] ED /[] Urgent Care (no appt availability in office) / [x] Appointment(In office/virtual)/ []  [facility]/ [] Home Care/ [] Refused Recommended Disposition /[] [facility]/ []  Follow-up with PCP Additional Notes: Pt states her right ankle is a little red, swollen and tender. Pt states she noticed these symptoms yesterday and the symptoms have improved today. Pt is not sure if it could be a spider bite. Pt states it feels similar to if she had pulled something. Virtual appointment scheduled for today. This RN educated pt on home care, new-worsening symptoms, when to call back/seek emergent care. Pt verbalized understanding and agrees to plan.    Copied from CRM [id number]. Topic: Clinical - Red Word Triage >> Mar 10, 2023 11:54 AM Thomes Dinning wrote: Red Word that prompted transfer to Nurse Triage: Patient believes she may have been bitten by a spider. Right ankle, is feeling a little warrm and red little swelling Reason for Disposition  [1] Red or very tender (to touch) area AND [2] started over 24 hours after the bite  Answer Assessment - Initial Assessment Questions 1. TYPE of INSECT: "What type of insect was it?"      Not sure if it could be a spider bite 2. ONSET: "When did you get bitten?"      Yesterday 3. LOCATION: "Where is the insect bite located?"      Right ankle 4. REDNESS: "Is the area red or pink?" If Yes, ask: "What size is area of redness?" (inches or cm). "When did the redness start?"     Size of chicken egg, started yesterday 5. PAIN: "Is there any pain?" If Yes, ask: "How bad is it?"  (Scale 1-10; or mild, moderate, severe)    Denies pain 6. ITCHING: "Does it itch?" If Yes, ask: "How bad is the itch?"    - MILD: doesn't interfere with normal  activities   - MODERATE-SEVERE: interferes with work, school, sleep, or other activities      Denies 7. SWELLING: "How big is the swelling?" (inches, cm, or compare to coins)     Not true ankle size, swelling has gone down today 8. OTHER SYMPTOMS: "Do you have any other symptoms?"  (e.g., difficulty breathing, hives)     Denies  Protocols used: Insect Bite-A-AH

## 2023-03-10 NOTE — Telephone Encounter (Signed)
 Copied from CRM [id number]. Topic: General - Other >> Mar 10, 2023 12:27 PM Truddie Crumble wrote: Reason for CRM: patient called stating she missed a call from the office

## 2023-07-06 ENCOUNTER — Ambulatory Visit: Payer: Self-pay

## 2023-07-06 NOTE — Telephone Encounter (Signed)
 FYI Only or Action Required?: Action required by provider: request for appointment and antibiotic request.  Patient was last seen in primary care on 03/10/2023 by Luke Chiquita SAUNDERS, DO. Called Nurse Triage reporting Tick Removal. Symptoms began yesterday. Interventions attempted: Rest, hydration, or home remedies. Symptoms are: gradually worsening.  Triage Disposition: See Physician Within 24 Hours  Patient/caregiver understands and will follow disposition?: Unsure  Copied from CRM [id number]. Topic: Clinical - Red Word Triage >> Jul 06, 2023  4:36 PM Tobias L wrote: Red Word that prompted transfer to Nurse Triage: tick bite, red and itchy Reason for Disposition  [1] Red or very tender (to touch) area AND [2] started over 24 hours after the bite  Answer Assessment - Initial Assessment Questions 1. ATTACHED:  Is the tick still on the skin?  (e.g., yes, no, unsure)     no  2. ONSET - TICK NOT STILL ATTACHED: If the tick has been removed, how long do you think the tick was attached before you removed it? (e.g., 5 hours, 2 days). When was this?     <24 hours, pulled 07/05/23 4. LOCATION: Where is the tick bite located? (e.g., arm, leg)     Ankle, right  5. TYPE of TICK: Is it a wood tick or a deer tick? (e.g., deer tick, wood tick; unsure)     Unsure 6. SIZE of TICK: How big is the tick? (e.g., size of poppy seed, apple seed, watermelon seed; unsure) Note: Deer ticks can be the size of a poppy seed (nymph) or an apple seed (adult).       Very small black, poppy seed 7. ENGORGED: Did the tick look flat or engorged (full, swollen)? (e.g., flat, engorged; unsure)     No 8. OTHER SYMPTOMS: Do you have any other symptoms? (e.g., fever, rash, redness at bite area, red ring around bite)     Redness, mild itch  Additional info: Patient previously follow by Joesph Sear, patient would like to stay with [facility] office Dr. Chandra or other provider for care.  Patient not available for  appointment tomorrow morning, requesting afternoon appointment on Thursday or anytime Friday. Would like prophylactic antibiotic called in . Please follow up with Niels [phone] for appointment and rx request.  Protocols used: Tick Bite-A-AH

## 2023-07-07 ENCOUNTER — Encounter: Payer: Self-pay | Admitting: Family Medicine

## 2023-07-07 ENCOUNTER — Ambulatory Visit (INDEPENDENT_AMBULATORY_CARE_PROVIDER_SITE_OTHER): Payer: Self-pay | Admitting: Family Medicine

## 2023-07-07 VITALS — BP 108/72 | HR 79 | Ht 63.5 in | Wt 108.4 lb

## 2023-07-07 DIAGNOSIS — W57XXXA Bitten or stung by nonvenomous insect and other nonvenomous arthropods, initial encounter: Secondary | ICD-10-CM

## 2023-07-07 DIAGNOSIS — S90561A Insect bite (nonvenomous), right ankle, initial encounter: Secondary | ICD-10-CM

## 2023-07-07 NOTE — Patient Instructions (Signed)
 It was nice to see you today,  We addressed the following topics today: - your tick bite does not look concerning.  I would put an anti itch cream like hydrocortisone or sarna on it.  If it gets worse let us  know but it should get better once you stop itching it.    Have a great day,  Rolan Slain, MD

## 2023-07-07 NOTE — Progress Notes (Signed)
   Acute Office Visit  Subjective:     Patient ID: Alicia Parrish, female    DOB: [birth year], 62 y.o.   MRN: [id number]  Chief Complaint  Patient presents with   Insect Bite    HPI Patient is in today for tick bite  Subjective - Tick bite discovered on foot on 07/05/2023 - Initially presented as small, itchy lesion thought to be mosquito bite or chigger bite from weed eating on Saturday - Husband removed tick with audible click sound - Concerned about potential Lyme disease  - Itching primarily on Monday 07/05/2023, minimal itching currently  Medications: Applied alcohol and Neosporin topically as recommended by nurse friend  ROS: Denies current significant itching. No systemic symptoms reported. No spreading rash or bullseye pattern noted.   ROS      Objective:    BP 108/72   Pulse 79   Ht 5' 3.5 (1.613 m)   Wt 108 lb 6.4 oz (49.2 kg)   SpO2 96%   BMI 18.90 kg/m    Physical Exam Gen: alert, oriented SKIN: Small papule on right medial foot below the ankle, minimal surrounding erythema, no significant spreading redness, no discharge    No results found for any visits on 07/07/23.      Assessment & Plan:   Tick bite of right ankle, initial encounter Assessment & Plan: Small tick removed from foot on 07/05/2023 with minimal local inflammatory response. No evidence of cellulitis or systemic infection. Low concern for Lyme disease given appearance and timeframe. - Continue topical care with over-the-counter hydrocortisone cream for itching - May use Sarna cream or calamine lotion for additional anti-itch relief - Avoid scratching to promote healing - Return if redness spreads to quarter size or larger, develops discharge, or other concerning symptoms develop - Photo documented in chart for comparison - Patient may send photos if worsening occurs rather than immediate office visit      Return if symptoms worsen or fail to improve.  Toribio MARLA Slain, MD

## 2023-07-07 NOTE — Assessment & Plan Note (Signed)
 Small tick removed from foot on 07/05/2023 with minimal local inflammatory response. No evidence of cellulitis or systemic infection. Low concern for Lyme disease given appearance and timeframe. - Continue topical care with over-the-counter hydrocortisone cream for itching - May use Sarna cream or calamine lotion for additional anti-itch relief - Avoid scratching to promote healing - Return if redness spreads to quarter size or larger, develops discharge, or other concerning symptoms develop - Photo documented in chart for comparison - Patient may send photos if worsening occurs rather than immediate office visit

## 2023-07-29 ENCOUNTER — Encounter: Payer: Self-pay | Admitting: Family Medicine

## 2023-08-04 ENCOUNTER — Other Ambulatory Visit: Payer: Self-pay

## 2023-08-04 DIAGNOSIS — D696 Thrombocytopenia, unspecified: Secondary | ICD-10-CM

## 2023-08-04 DIAGNOSIS — Z1329 Encounter for screening for other suspected endocrine disorder: Secondary | ICD-10-CM

## 2023-08-04 DIAGNOSIS — E559 Vitamin D deficiency, unspecified: Secondary | ICD-10-CM

## 2023-08-04 DIAGNOSIS — E7800 Pure hypercholesterolemia, unspecified: Secondary | ICD-10-CM

## 2023-08-04 DIAGNOSIS — Z1159 Encounter for screening for other viral diseases: Secondary | ICD-10-CM

## 2023-08-12 ENCOUNTER — Other Ambulatory Visit: Payer: Self-pay

## 2023-08-12 DIAGNOSIS — D696 Thrombocytopenia, unspecified: Secondary | ICD-10-CM

## 2023-08-12 DIAGNOSIS — Z1159 Encounter for screening for other viral diseases: Secondary | ICD-10-CM

## 2023-08-12 DIAGNOSIS — E7800 Pure hypercholesterolemia, unspecified: Secondary | ICD-10-CM

## 2023-08-12 DIAGNOSIS — Z1329 Encounter for screening for other suspected endocrine disorder: Secondary | ICD-10-CM

## 2023-08-12 DIAGNOSIS — E559 Vitamin D deficiency, unspecified: Secondary | ICD-10-CM

## 2023-08-13 LAB — CBC WITH DIFFERENTIAL/PLATELET
Basophils Absolute: 0 x10E3/uL (ref 0.0–0.2)
Basos: 0 %
EOS (ABSOLUTE): 0.1 x10E3/uL (ref 0.0–0.4)
Eos: 1 %
Hematocrit: 39.2 % (ref 34.0–46.6)
Hemoglobin: 12.8 g/dL (ref 11.1–15.9)
Immature Grans (Abs): 0 x10E3/uL (ref 0.0–0.1)
Immature Granulocytes: 0 %
Lymphocytes Absolute: 1.5 x10E3/uL (ref 0.7–3.1)
Lymphs: 33 %
MCH: 29.4 pg (ref 26.6–33.0)
MCHC: 32.7 g/dL (ref 31.5–35.7)
MCV: 90 fL (ref 79–97)
Monocytes Absolute: 0.3 x10E3/uL (ref 0.1–0.9)
Monocytes: 6 %
Neutrophils Absolute: 2.8 x10E3/uL (ref 1.4–7.0)
Neutrophils: 60 %
Platelets: 148 x10E3/uL — ABNORMAL LOW (ref 150–450)
RBC: 4.35 x10E6/uL (ref 3.77–5.28)
RDW: 12.9 % (ref 11.7–15.4)
WBC: 4.6 x10E3/uL (ref 3.4–10.8)

## 2023-08-13 LAB — LIPID PANEL
Chol/HDL Ratio: 2.2 ratio (ref 0.0–4.4)
Cholesterol, Total: 184 mg/dL (ref 100–199)
HDL: 84 mg/dL (ref >39)
LDL Chol Calc (NIH): 88 mg/dL (ref 0–99)
Triglycerides: 64 mg/dL (ref 0–149)
VLDL Cholesterol Cal: 12 mg/dL (ref 5–40)

## 2023-08-13 LAB — COMPREHENSIVE METABOLIC PANEL WITH GFR
ALT: 12 IU/L (ref 0–32)
AST: 16 IU/L (ref 0–40)
Albumin: 4.3 g/dL (ref 3.9–4.9)
Alkaline Phosphatase: 73 IU/L (ref 44–121)
BUN/Creatinine Ratio: 17 (ref 12–28)
BUN: 13 mg/dL (ref 8–27)
Bilirubin Total: 0.4 mg/dL (ref 0.0–1.2)
CO2: 23 mmol/L (ref 20–29)
Calcium: 9.5 mg/dL (ref 8.7–10.3)
Chloride: 106 mmol/L (ref 96–106)
Creatinine, Ser: 0.77 mg/dL (ref 0.57–1.00)
Globulin, Total: 2.7 g/dL (ref 1.5–4.5)
Glucose: 78 mg/dL (ref 70–99)
Potassium: 4.2 mmol/L (ref 3.5–5.2)
Sodium: 141 mmol/L (ref 134–144)
Total Protein: 7 g/dL (ref 6.0–8.5)
eGFR: 88 mL/min/1.73 (ref >59)

## 2023-08-13 LAB — HIV ANTIBODY (ROUTINE TESTING W REFLEX): HIV Screen 4th Generation wRfx: NONREACTIVE

## 2023-08-13 LAB — VITAMIN D 25 HYDROXY (VIT D DEFICIENCY, FRACTURES): Vit D, 25-Hydroxy: 83 ng/mL (ref 30.0–100.0)

## 2023-08-13 LAB — HCV RNA QUANT RFLX ULTRA OR GENOTYP: HCV Quant Baseline: NOT DETECTED [IU]/mL

## 2023-08-13 LAB — HEMOGLOBIN A1C
Est. average glucose Bld gHb Est-mCnc: 108 mg/dL
Hgb A1c MFr Bld: 5.4 % (ref 4.8–5.6)

## 2023-08-13 LAB — TSH: TSH: 1.8 u[IU]/mL (ref 0.450–4.500)

## 2023-08-15 ENCOUNTER — Ambulatory Visit: Payer: Self-pay

## 2023-08-19 ENCOUNTER — Ambulatory Visit (INDEPENDENT_AMBULATORY_CARE_PROVIDER_SITE_OTHER): Payer: Self-pay

## 2023-08-19 VITALS — BP 100/63 | HR 79 | Temp 97.6°F | Ht 63.5 in | Wt 110.1 lb

## 2023-08-19 DIAGNOSIS — M810 Age-related osteoporosis without current pathological fracture: Secondary | ICD-10-CM

## 2023-08-19 DIAGNOSIS — Z0000 Encounter for general adult medical examination without abnormal findings: Secondary | ICD-10-CM | POA: Insufficient documentation

## 2023-08-19 NOTE — Patient Instructions (Signed)
 It was nice to see you today!  As we discussed in clinic:  -Continue eating well balanced and exercising regularly to support a healthy lifestyle  -You are up to date on all of your health screenings  -You are a candidate for a one-time pneumonia vaccine if you decide that you want it   I will plan to see you back in 1 year for your next physical with labs the week before!  If you have any problems before your next visit feel free to message me via MyChart (minor issues or questions) or call the office, otherwise you may reach out to schedule an office visit.  Thank you! Saddie Sacks, PA-C

## 2023-08-19 NOTE — Progress Notes (Signed)
 Established Patient Office Visit  Subjective   Patient ID: Alicia Parrish, female    DOB: [birth year]  Age: 63 y.o. MRN: [id number]  Chief Complaint  Patient presents with   Annual Exam    Physical    HPI  Alicia Parrish is a 63 y.o. female who presents to the clinic today for complete physical exam.  She is only taking over-the-counter vitamin D  and B12 supplements daily. She also takes risedronate once monthly for osteoporosis. Otherwise no significant past medical history or prescription daily medications.  She is eating well balanced and exercises regularly through walking and Zumba. Reports regular bowel movements. She is sleeping well.  She just recently had her mammogram done. She does not have any other concerns to address today.     ROS Per HPI.    Objective:     BP 100/63   Pulse 79   Temp 97.6 F (36.4 C) (Oral)   Ht 5' 3.5 (1.613 m)   Wt 110 lb 2.2 oz (50 kg)   SpO2 99%   BMI 19.20 kg/m    Physical Exam Constitutional:      General: She is not in acute distress.    Appearance: Normal appearance.  HENT:     Right Ear: Tympanic membrane normal.     Left Ear: Tympanic membrane normal.     Mouth/Throat:     Mouth: Mucous membranes are moist.     Pharynx: Oropharynx is clear.  Eyes:     Pupils: Pupils are equal, round, and reactive to light.  Cardiovascular:     Rate and Rhythm: Normal rate and regular rhythm.     Heart sounds: Normal heart sounds. No murmur heard.    No friction rub. No gallop.  Pulmonary:     Effort: Pulmonary effort is normal. No respiratory distress.     Breath sounds: Normal breath sounds.  Abdominal:     General: Abdomen is flat. Bowel sounds are normal.     Palpations: Abdomen is soft.  Musculoskeletal:        General: No swelling.     Cervical back: Normal range of motion.  Lymphadenopathy:     Cervical: No cervical adenopathy.  Skin:    General: Skin is warm and dry.  Neurological:     General: No focal deficit present.      Mental Status: She is alert.  Psychiatric:        Mood and Affect: Mood normal.        Behavior: Behavior normal.        Thought Content: Thought content normal.      Results for orders placed or performed in visit on 08/19/23  HM DEXA SCAN  Result Value Ref Range   HM Dexa Scan Osteopenia   HM MAMMOGRAPHY  Result Value Ref Range   HM Mammogram 0-4 Bi-Rad 0-4 Bi-Rad, Self Reported Normal    Last CBC Lab Results  Component Value Date   WBC 4.6 08/12/2023   HGB 12.8 08/12/2023   HCT 39.2 08/12/2023   MCV 90 08/12/2023   MCH 29.4 08/12/2023   RDW 12.9 08/12/2023   PLT 148 (L) 08/12/2023   Last metabolic panel Lab Results  Component Value Date   GLUCOSE 78 08/12/2023   NA 141 08/12/2023   K 4.2 08/12/2023   CL 106 08/12/2023   CO2 23 08/12/2023   BUN 13 08/12/2023   CREATININE 0.77 08/12/2023   EGFR 88 08/12/2023   CALCIUM 9.5 08/12/2023  PROT 7.0 08/12/2023   ALBUMIN 4.3 08/12/2023   LABGLOB 2.7 08/12/2023   AGRATIO 1.8 07/07/2021   BILITOT 0.4 08/12/2023   ALKPHOS 73 08/12/2023   AST 16 08/12/2023   ALT 12 08/12/2023   Last lipids Lab Results  Component Value Date   CHOL 184 08/12/2023   HDL 84 08/12/2023   LDLCALC 88 08/12/2023   LDLDIRECT 117.6 08/24/2012   TRIG 64 08/12/2023   CHOLHDL 2.2 08/12/2023   Last hemoglobin A1c Lab Results  Component Value Date   HGBA1C 5.4 08/12/2023   Last thyroid functions Lab Results  Component Value Date   TSH 1.800 08/12/2023   Last vitamin D  Lab Results  Component Value Date   VD25OH 83.0 08/12/2023      The 10-year ASCVD risk score (Arnett DK, et al., 2019) is: 1.6%    Assessment & Plan:   Osteoporosis, unspecified osteoporosis type, unspecified pathological fracture presence Assessment & Plan: Continue risedronate 150 mg once a month,  vitamin D  supplements.   General medical exam Assessment & Plan: Alicia Parrish over lab work in detail with the patient. Answered all patient questions. Went over  and encouraged/ordered age-appropriate health screenings to include pneumonia vaccine, flu vaccine, shingles vaccine. Patient is up-to-date on all other screenings. Declined vaccines today. Encouraged patient to aim for 150+ minutes of physical activity per week or just increase their physical activity in general in combination with a well balanced diet that prioritizes protein and fiber to support a healthy lifestyle. Will plan for next physical in 1 year, sooner PRN. Patient verbalized understanding and was in agreement with the plan.      Return in about 1 year (around 08/18/2024) for Physical.    Saddie JULIANNA Sacks, PA-C

## 2023-08-19 NOTE — Assessment & Plan Note (Signed)
 Went over lab work in detail with the patient. Answered all patient questions. Went over and encouraged/ordered age-appropriate health screenings to include pneumonia vaccine, flu vaccine, shingles vaccine. Patient is up-to-date on all other screenings. Declined vaccines today. Encouraged patient to aim for 150+ minutes of physical activity per week or just increase their physical activity in general in combination with a well balanced diet that prioritizes protein and fiber to support a healthy lifestyle. Will plan for next physical in 1 year, sooner PRN. Patient verbalized understanding and was in agreement with the plan.

## 2023-08-19 NOTE — Assessment & Plan Note (Signed)
 Continue risedronate 150 mg once a month,  vitamin D  supplements.

## 2023-10-21 LAB — HM MAMMOGRAPHY

## 2023-10-26 ENCOUNTER — Encounter: Payer: Self-pay | Admitting: Obstetrics and Gynecology

## 2023-11-25 ENCOUNTER — Ambulatory Visit: Payer: Self-pay | Admitting: Family Medicine

## 2023-11-25 ENCOUNTER — Ambulatory Visit: Payer: Self-pay

## 2023-11-25 ENCOUNTER — Encounter: Payer: Self-pay | Admitting: Family Medicine

## 2023-11-25 VITALS — BP 110/67 | HR 95 | Temp 98.0°F | Ht 63.5 in | Wt 110.1 lb

## 2023-11-25 VITALS — BP 110/67 | HR 95 | Temp 98.0°F | Ht 63.5 in | Wt 110.0 lb

## 2023-11-25 DIAGNOSIS — J989 Respiratory disorder, unspecified: Secondary | ICD-10-CM

## 2023-11-25 DIAGNOSIS — Z1159 Encounter for screening for other viral diseases: Secondary | ICD-10-CM

## 2023-11-25 MED ORDER — HYDROCODONE BIT-HOMATROP MBR 5-1.5 MG/5ML PO SOLN: 5.0000 mL | Freq: Every evening | ORAL | 0 refills | Status: AC | PRN

## 2023-11-25 MED ORDER — AZITHROMYCIN 250 MG PO TABS: ORAL_TABLET | ORAL | 0 refills | Status: AC

## 2023-11-25 MED ORDER — BENZONATATE 200 MG PO CAPS: 200.0000 mg | ORAL_CAPSULE | Freq: Two times a day (BID) | ORAL | 0 refills | Status: AC | PRN

## 2023-11-25 NOTE — Assessment & Plan Note (Signed)
 Covid, flu negative.  Experiencing body aches, headache, sinus congestion.  Would favor other viral infection over bacterial but given her age and severity of her cough and other symptoms I have sent in 5 days of azithromycin.  Prescribed tessalon perles and hycodan cough syrup for cough.  Recommended otc options for other symptoms.  Advised to inform us  if she is not improving after finishing abx.

## 2023-11-25 NOTE — Progress Notes (Signed)
 See prog not from same day

## 2023-11-25 NOTE — Patient Instructions (Addendum)
 It was nice to see you today,  We addressed the following topics today: - I am sending in tessalon perles for cough and hycodan cough syrup for nocturnal cough.   - keep both of these out of the reach of children - over the counter options for symptoms are: anything with guaifenesin or dextromethorphan in it; cepacol lozenges; afrin nasal spray, saline nasal spray - I am sending in 5 days of azithromycin  Have a great day,  Rolan Slain, MD

## 2023-11-25 NOTE — Telephone Encounter (Signed)
 FYI Only or Action Required?: Action required by provider: request for appointment.  Patient was last seen in primary care on 08/19/2023 by Gayle Saddie FALCON, PA-C.  Called Nurse Triage reporting Cough.  Symptoms began several days ago.  Interventions attempted: OTC medications:  SABRA  Symptoms are: gradually worsening.Cough,headache, COVID negative.  Triage Disposition: See Physician Within 24 Hours  Patient/caregiver understands and will follow disposition?: Yes     Copied from CRM #[id number]. Topic: Clinical - Red Word Triage >> Nov 25, 2023  8:03 AM Donna BRAVO wrote: Red Word that prompted transfer to Nurse Triage: Patient calling  Symptoms: -Started Tuesday -upper and lower raspatory issues  -cant sleep -no fever -cough unable to cough up anything -headaches - ear and sinus pressure -very miserable  -extremely weak  -covid test this morning was negative Reason for Disposition  [1] Continuous (nonstop) coughing interferes with work or school AND [2] no improvement using cough treatment per Care Advice  Answer Assessment - Initial Assessment Questions 1. ONSET: When did the cough begin?      Tuesday 2. SEVERITY: How bad is the cough today?      severe 3. SPUTUM: Describe the color of your sputum (e.g., none, dry cough; clear, white, yellow, green)     no 4. HEMOPTYSIS: Are you coughing up any blood? If Yes, ask: How much? (e.g., flecks, streaks, tablespoons, etc.)     no 5. DIFFICULTY BREATHING: Are you having difficulty breathing? If Yes, ask: How bad is it? (e.g., mild, moderate, severe)      no 6. FEVER: Do you have a fever? If Yes, ask: What is your temperature, how was it measured, and when did it start?     no 7. CARDIAC HISTORY: Do you have any history of heart disease? (e.g., heart attack, congestive heart failure)      no 8. LUNG HISTORY: Do you have any history of lung disease?  (e.g., pulmonary embolus, asthma, emphysema)     no 9. PE RISK  FACTORS: Do you have a history of blood clots? (or: recent major surgery, recent prolonged travel, bedridden)     no 10. OTHER SYMPTOMS: Do you have any other symptoms? (e.g., runny nose, wheezing, chest pain)       no 11. PREGNANCY: Is there any chance you are pregnant? When was your last menstrual period?       no 12. TRAVEL: Have you traveled out of the country in the last month? (e.g., travel history, exposures)       no  Protocols used: Cough - Acute Non-Productive-A-AH

## 2023-11-25 NOTE — Progress Notes (Signed)
   Acute Office Visit  Subjective:     Patient ID: Alicia Parrish, female    DOB: 1960-04-16, 63 y.o.   MRN: [id number]  Chief Complaint  Patient presents with   Cough   Nasal Congestion    Onset: Monday Meds taken: Mucinex DM    HPI Patient is in today for respiratory illness  Pt states symptoms started Tuesday.  Sx include: sore throat (now better), cough, sinus congestion, headache, body aches. No fever.  Has taken tylenol  and nsaids and mucinex.  Took a home covid test which was negative.   Test performed in the office for flu was negative.    ROS      Objective:    BP 110/67   Pulse 95   Temp 98 F (36.7 C) (Oral)   Ht 5' 3.5 (1.613 m)   Wt 110 lb 0.2 oz (49.9 kg)   SpO2 100%   BMI 19.18 kg/m    Physical Exam Gen: alert, oriented Heent: mild erythema of the posterior oropharynx.  No exudates or ulcerations Cv: rrr Pulm: no wheezes or rhonchi. no respiratory distress Psych: pleasant affect  No results found for any visits on 11/25/23.      Assessment & Plan:   Respiratory illness Assessment & Plan: Covid, flu negative.  Experiencing body aches, headache, sinus congestion.  Would favor other viral infection over bacterial but given her age and severity of her cough and other symptoms I have sent in 5 days of azithromycin.  Prescribed tessalon perles and hycodan cough syrup for cough.  Recommended otc options for other symptoms.  Advised to inform us  if she is not improving after finishing abx.       No follow-ups on file.  Alicia MARLA Slain, MD

## 2023-11-30 ENCOUNTER — Other Ambulatory Visit: Payer: Self-pay | Admitting: Family Medicine

## 2023-11-30 ENCOUNTER — Ambulatory Visit: Payer: Self-pay

## 2023-11-30 NOTE — Telephone Encounter (Signed)
 Nurse triage first attempt, no answer, left vm.

## 2023-11-30 NOTE — Telephone Encounter (Signed)
 FYI Only or Action Required?: Action required by provider: Requesting more abx.  Patient was last seen in primary care on 11/25/2023 by Chandra Toribio POUR, MD.  Called Nurse Triage reporting Cough.  Symptoms began a week ago.  Interventions attempted: Prescription medications: Azithromycin 250.  Symptoms are: gradually improving.  Triage Disposition: Information or Advice Only Call  Patient/caregiver understands and will follow disposition?: Yes  Reason for Disposition  General information question, no triage required and triager able to answer question  Answer Assessment - Initial Assessment Questions Patient stated she heard doctor at visit say if she needed more for lingering symptoms more could be called in. Requesting more abx be sent to [facility] on file. Please advise.   1. REASON FOR CALL: What is the main reason for your call? or How can I best help you?     Requesting extended dose of azithromycin (ZITHROMAX) 250 MG tablet for lingering symptoms  2. SYMPTOMS : Do you have any symptoms?      Chest and nasal congestion. Denies new or worsening symptoms.  Protocols used: Information Only Call - No Triage-A-AH  Message from [place] C sent at 11/30/2023  9:18 AM EST  Summary: rx req / congestion   Reason for Triage: The patient has called to share that their congestion has not fully improved and they would like to be prescribed a refill of azithromycin (ZITHROMAX) 250 MG tablet [[id number]] to help Please contact the patient further when possible

## 2023-12-06 NOTE — Telephone Encounter (Signed)
 Called pt LVM to contact the office if pt calls please advised   Per Dr. Chandra Please call the pt and let her know I'm sorry for not getting back sooner. I meant to call last week but did not get to it. Can you ask if her symptoms are getting any better? Which symptoms remain? I will still send in additional abx if she is not improving at al

## 2023-12-06 NOTE — Telephone Encounter (Signed)
 Please call the pt and let her know I'm sorry for not getting back sooner.  I meant to call last week but did not get to it.  Can you ask if her symptoms are getting any better? Which symptoms remain?  I will still send in additional abx if she is not improving at all.

## 2023-12-07 NOTE — Telephone Encounter (Signed)
 2 Attempt called patient no answer will send a mychart message

## 2024-02-03 ENCOUNTER — Other Ambulatory Visit: Payer: Self-pay

## 2024-02-03 ENCOUNTER — Ambulatory Visit: Payer: Self-pay

## 2024-02-03 DIAGNOSIS — H6121 Impacted cerumen, right ear: Secondary | ICD-10-CM

## 2024-02-03 NOTE — Telephone Encounter (Signed)
 Referral placed to dr. Anthony office

## 2024-02-03 NOTE — Telephone Encounter (Signed)
 FYI Only or Action Required?: Action required by provider: referral request.  Patient was last seen in primary care on 11/25/2023 by Chandra Toribio POUR, MD.  Called Nurse Triage reporting Referral.  Symptoms began a week ago.  Interventions attempted: Nothing.  Symptoms are: gradually worsening.  Triage Disposition: Call PCP When Office is Open  Patient/caregiver understands and will follow disposition?:     Message from Ambrose S sent at 02/03/2024 10:58 AM EST  Reason for Triage: Patient's right ear is bugging her for a week and getting worse. She states it is wax build up and would like a referral to ENT    Reason for Disposition  [1] Caller requesting NON-URGENT health information AND [2] PCP's office is the best resource  Answer Assessment - Initial Assessment Questions 1. REASON FOR CALL: What is the main reason for your call? or How can I best help you?     Pt calling requesting referral to ENT, willing to see anyone with Dr. Anthony practice as she wants to see provider asap for R ear symptoms r/t built up wax. Pt states she went several years ago but would need a referral for appt. Pt has called practice and was referred back to PCP to request referral. Pt denies any h/a, no pressure, reports decrease in hearing sounds like I am in a tunnel x 1 week. Pt reports she will be self pay for appt.   ENT office info  P: [phone] Address: [address] [place] [address]  Discussed referral process can take up to 3-5 days. Pt voiced understanding and requested update via cell on process once referral sent.  Protocols used: Information Only Call - No Triage-A-AH

## 2024-02-23 ENCOUNTER — Institutional Professional Consult (permissible substitution) (INDEPENDENT_AMBULATORY_CARE_PROVIDER_SITE_OTHER): Payer: Self-pay | Admitting: Physician Assistant

## 2024-08-16 ENCOUNTER — Other Ambulatory Visit: Payer: Self-pay
